# Patient Record
Sex: Female | Born: 1990 | Race: Black or African American | Hispanic: No | Marital: Single | Smoking: Former smoker
Health system: Southern US, Community
[De-identification: ages and names within clinical notes are randomized; demographics above are authoritative.]

## PROBLEM LIST (undated history)

## (undated) DIAGNOSIS — N390 Urinary tract infection, site not specified: Secondary | ICD-10-CM

---
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
## 2011-11-07 ENCOUNTER — Emergency Department (HOSPITAL_COMMUNITY)
Admission: EM | Admit: 2011-11-07 | Discharge: 2011-11-07 | Discharge status: Absent without leave | Payer: Self-pay | Source: Home / Self Care

## 2011-11-22 ENCOUNTER — Emergency Department (HOSPITAL_COMMUNITY)
Admission: EM | Admit: 2011-11-22 | Discharge: 2011-11-22 | Disposition: A | Discharge status: Home Health | Payer: Self-pay | Attending: Emergency Medicine | Admitting: Emergency Medicine

## 2011-11-22 DIAGNOSIS — N898 Other specified noninflammatory disorders of vagina: Secondary | ICD-10-CM | POA: Insufficient documentation

## 2011-11-22 DIAGNOSIS — N938 Other specified abnormal uterine and vaginal bleeding: Secondary | ICD-10-CM | POA: Insufficient documentation

## 2011-11-22 DIAGNOSIS — N949 Unspecified condition associated with female genital organs and menstrual cycle: Secondary | ICD-10-CM | POA: Insufficient documentation

## 2011-11-22 DIAGNOSIS — R109 Unspecified abdominal pain: Secondary | ICD-10-CM | POA: Insufficient documentation

## 2011-11-22 LAB — COMPREHENSIVE METABOLIC PANEL
ALT: 13 U/L (ref 0–35)
AST: 17 U/L (ref 0–37)
Albumin: 3.7 g/dL (ref 3.5–5.2)
Alkaline Phosphatase: 63 U/L (ref 39–117)
BUN: 12 mg/dL (ref 6–23)
CO2: 27 mEq/L (ref 19–32)
Calcium: 9.6 mg/dL (ref 8.4–10.5)
Chloride: 103 mEq/L (ref 96–112)
Creatinine, Ser: 0.82 mg/dL (ref 0.50–1.10)
GFR calc Af Amer: >90 mL/min (ref >90)
GFR calc non Af Amer: >90 mL/min (ref >90)
Glucose, Bld: 82 mg/dL (ref 70–99)
Potassium: 3.7 mEq/L (ref 3.5–5.1)
Sodium: 138 mEq/L (ref 135–145)
Total Bilirubin: 0.5 mg/dL (ref 0.3–1.2)
Total Protein: 7.2 g/dL (ref 6.0–8.3)

## 2011-11-22 LAB — URINALYSIS, ROUTINE W REFLEX MICROSCOPIC
Bilirubin Urine: NEGATIVE
Glucose, UA: NEGATIVE mg/dL
Ketones, ur: NEGATIVE mg/dL
Leukocytes, UA: NEGATIVE
Nitrite: NEGATIVE
Protein, ur: NEGATIVE mg/dL
Specific Gravity, Urine: 1.014 (ref 1.005–1.030)
Urobilinogen, UA: 0.2 mg/dL (ref 0.0–1.0)
pH: 7 (ref 5.0–8.0)

## 2011-11-22 LAB — CBC
HCT: 39.8 % (ref 36.0–46.0)
Hemoglobin: 13.8 g/dL (ref 12.0–15.0)
MCH: 31.2 pg (ref 26.0–34.0)
MCHC: 34.7 g/dL (ref 30.0–36.0)
MCV: 89.8 fL (ref 78.0–100.0)
Platelets: 263 10*3/uL (ref 150–400)
RBC: 4.43 MIL/uL (ref 3.87–5.11)
RDW: 12.9 % (ref 11.5–15.5)
WBC: 7 10*3/uL (ref 4.0–10.5)

## 2011-11-22 LAB — DIFFERENTIAL
Basophils Absolute: 0 10*3/uL (ref 0.0–0.1)
Basophils Relative: 1 % (ref 0–1)
Eosinophils Absolute: 0.1 10*3/uL (ref 0.0–0.7)
Eosinophils Relative: 1 % (ref 0–5)
Lymphocytes Relative: 27 % (ref 12–46)
Lymphs Abs: 1.9 10*3/uL (ref 0.7–4.0)
Monocytes Absolute: 0.5 10*3/uL (ref 0.1–1.0)
Monocytes Relative: 7 % (ref 3–12)
Neutro Abs: 4.5 10*3/uL (ref 1.7–7.7)
Neutrophils Relative %: 65 % (ref 43–77)

## 2011-11-22 LAB — URINE MICROSCOPIC-ADD ON

## 2011-11-22 LAB — WET PREP, GENITAL
Clue Cells Wet Prep HPF POC: NONE SEEN
Trich, Wet Prep: NONE SEEN
Yeast Wet Prep HPF POC: NONE SEEN

## 2011-11-22 LAB — PREGNANCY, URINE: Preg Test, Ur: NEGATIVE

## 2011-11-22 MED ORDER — MEDROXYPROGESTERONE ACETATE 5 MG PO TABS: 5.0000 mg | ORAL_TABLET | Freq: Every day | ORAL | Status: DC

## 2011-11-22 NOTE — ED Provider Notes (Signed)
History     CSN: [id number]  Arrival date & time 11/22/11  4098   First MD Initiated Contact with Patient 11/22/11 1021      Chief Complaint  Patient presents with  . Abdominal Pain    (Consider location/radiation/quality/duration/timing/severity/associated sxs/prior treatment) HPI Patient presents with abdominal pain and vaginal bleeding. She states that her LMP was the last week in October and she has been bleeding ever since. She denies a single day without vaginal bleeding since then. She uses approximately 2 pads or tampons a day. She has PMS symptoms monthly like her period is starting but she states that it is always there. She describes the bleeding as heavy, dark, and with blood clots. She states that she was on an oral contraceptive and stopped taking it in October. She reports abdominal pain that began about 2 weeks ago. She describes it as suprapubic and worse in the RLQ. She states it radiates to her R midabdominal area. She rates the pain as 7/10. She denies any syncope, dizziness, and headache.   No past medical history on file.  No past surgical history on file.  No family history on file.  History  Substance Use Topics  . Smoking status: Not on file  . Smokeless tobacco: Not on file  . Alcohol Use: Not on file    OB History    No data available      Review of Systems All pertinent positives and negatives reviewed in the history of present illness  Allergies  Review of patient's allergies indicates no known allergies.  Home Medications   Current Outpatient Rx  Name Route Sig Dispense Refill  . ACETAMINOPHEN 500 MG PO TABS Oral Take 1,000 mg by mouth every 6 (six) hours as needed. For pain    . VITAMIN C 500 MG PO TABS Oral Take 500 mg by mouth every other day.      BP 106/54  Pulse 71  Temp 98.1 F (36.7 C)  Resp 16  SpO2 99%  Physical Exam  Constitutional: She is oriented to person, place, and time. She appears well-developed and  well-nourished. No distress.       Patient is pleasant and cooperative. In NAD.  HENT:  Head: Normocephalic and atraumatic.  Cardiovascular: Normal rate, regular rhythm and normal heart sounds.   Pulmonary/Chest: Effort normal and breath sounds normal.  Abdominal: Soft. Normal appearance and bowel sounds are normal. She exhibits no distension and no mass. There is tenderness in the right lower quadrant and suprapubic area. There is no rebound and no guarding.  Genitourinary: Pelvic exam was performed with patient supine. There is no rash, tenderness, lesion or injury on the right labia. There is no rash, tenderness, lesion or injury on the left labia. Cervix exhibits no motion tenderness, no discharge and no friability. Right adnexum displays no mass and no tenderness. Left adnexum displays no mass and no tenderness. There is bleeding around the vagina. No erythema around the vagina. No foreign body around the vagina. No signs of injury around the vagina. No vaginal discharge found.  Neurological: She is alert and oriented to person, place, and time.  Skin: Skin is warm and dry. She is not diaphoretic.  Psychiatric: She has a normal mood and affect. Her behavior is normal.    ED Course  Procedures (including critical care time)  Labs Reviewed  URINALYSIS, ROUTINE W REFLEX MICROSCOPIC - Abnormal; Notable for the following:    Hgb urine dipstick MODERATE (*)  All other components within normal limits  WET PREP, GENITAL - Abnormal; Notable for the following:    WBC, Wet Prep HPF POC FEW (*)    All other components within normal limits  URINE MICROSCOPIC-ADD ON - Abnormal; Notable for the following:    Squamous Epithelial / LPF FEW (*)    All other components within normal limits  CBC  DIFFERENTIAL  PREGNANCY, URINE  COMPREHENSIVE METABOLIC PANEL  GC/CHLAMYDIA PROBE AMP, GENITAL   Abnormal Uterine Bleeding. Patient has normal hemoglobin and hematocrit. No adnexal or cervical tenderness  on exam. Checked on the patient and she states she is feeling better with less pain.    MDM  Patient most likely has dysfunctional uterine bleeding. Will treat with 5 days of Provera 10 mg PO. Patient will need to follow up with gynecologist.         Carlyle Dolly, PA-C 11/23/11 [phone]

## 2011-11-22 NOTE — ED Notes (Signed)
Pt does not appear to be in any distress. amb in the room and to the br. No c/o any.

## 2011-11-22 NOTE — ED Notes (Signed)
Pelvic exam assist. Cultures obtained.

## 2011-11-22 NOTE — ED Notes (Signed)
abd pain for 1 1/2 weeks has had vag bleeding since nov stop bcp in oct

## 2011-11-23 LAB — GC/CHLAMYDIA PROBE AMP, GENITAL
Chlamydia, DNA Probe: NEGATIVE
GC Probe Amp, Genital: NEGATIVE

## 2011-11-24 NOTE — ED Provider Notes (Signed)
Medical screening examination/treatment/procedure(s) were performed by non-physician practitioner and as supervising physician I was immediately available for consultation/collaboration.  Pancho Rushing, MD 11/24/11 0749 

## 2012-01-15 ENCOUNTER — Encounter (HOSPITAL_COMMUNITY): Payer: Self-pay | Admitting: *Deleted

## 2012-01-15 ENCOUNTER — Emergency Department (HOSPITAL_COMMUNITY)
Admission: EM | Admit: 2012-01-15 | Discharge: 2012-01-15 | Disposition: A | Discharge status: Home / Self Care | Payer: Medicaid Other | Attending: Emergency Medicine | Admitting: Emergency Medicine

## 2012-01-15 DIAGNOSIS — Z202 Contact with and (suspected) exposure to infections with a predominantly sexual mode of transmission: Secondary | ICD-10-CM | POA: Insufficient documentation

## 2012-01-15 LAB — URINALYSIS, ROUTINE W REFLEX MICROSCOPIC
Bilirubin Urine: NEGATIVE
Glucose, UA: NEGATIVE mg/dL
Hgb urine dipstick: NEGATIVE
Protein, ur: NEGATIVE mg/dL
Urobilinogen, UA: 0.2 mg/dL (ref 0.0–1.0)

## 2012-01-15 LAB — URINE MICROSCOPIC-ADD ON

## 2012-01-15 NOTE — ED Notes (Signed)
The pt has had lower abd pain for 10 days and she is late for her period 10 days .  She has been having sex with someone she has  Marthe Patch has a std and she  Wants to be tested for everything.  lmp   Deb 22

## 2012-01-15 NOTE — ED Notes (Signed)
Pt states that she has had a late period for the past 9 days. Pt states she is having lower abdominal pain. Pt seen in Jan for similar abdominal pain. Pt state she would also liked to be checked for STDs. Pt states that when the pain does come it is a sharp shooting pain. Pt denies any problems with bowel movements but does state some discharge.

## 2012-01-17 ENCOUNTER — Emergency Department (HOSPITAL_COMMUNITY)
Admission: EM | Admit: 2012-01-17 | Discharge: 2012-01-17 | Disposition: A | Discharge status: Home / Self Care | Payer: Self-pay | Attending: Emergency Medicine | Admitting: Emergency Medicine

## 2012-01-17 ENCOUNTER — Encounter (HOSPITAL_COMMUNITY): Payer: Self-pay | Admitting: *Deleted

## 2012-01-17 DIAGNOSIS — M549 Dorsalgia, unspecified: Secondary | ICD-10-CM | POA: Insufficient documentation

## 2012-01-17 DIAGNOSIS — N760 Acute vaginitis: Secondary | ICD-10-CM | POA: Insufficient documentation

## 2012-01-17 DIAGNOSIS — R109 Unspecified abdominal pain: Secondary | ICD-10-CM | POA: Insufficient documentation

## 2012-01-17 DIAGNOSIS — R509 Fever, unspecified: Secondary | ICD-10-CM | POA: Insufficient documentation

## 2012-01-17 DIAGNOSIS — N898 Other specified noninflammatory disorders of vagina: Secondary | ICD-10-CM | POA: Insufficient documentation

## 2012-01-17 DIAGNOSIS — B9689 Other specified bacterial agents as the cause of diseases classified elsewhere: Secondary | ICD-10-CM | POA: Insufficient documentation

## 2012-01-17 DIAGNOSIS — Z202 Contact with and (suspected) exposure to infections with a predominantly sexual mode of transmission: Secondary | ICD-10-CM | POA: Insufficient documentation

## 2012-01-17 DIAGNOSIS — A499 Bacterial infection, unspecified: Secondary | ICD-10-CM | POA: Insufficient documentation

## 2012-01-17 LAB — WET PREP, GENITAL: Trich, Wet Prep: NONE SEEN

## 2012-01-17 LAB — PREGNANCY, URINE: Preg Test, Ur: NEGATIVE

## 2012-01-17 LAB — URINALYSIS, ROUTINE W REFLEX MICROSCOPIC
Bilirubin Urine: NEGATIVE
Nitrite: NEGATIVE
Protein, ur: NEGATIVE mg/dL
Specific Gravity, Urine: 1.015 (ref 1.005–1.030)
Urobilinogen, UA: 0.2 mg/dL (ref 0.0–1.0)

## 2012-01-17 MED ORDER — ONDANSETRON 4 MG PO TBDP
4.0000 mg | ORAL_TABLET | Freq: Once | ORAL | Status: AC
  Administered 2012-01-17: 4 mg via ORAL
  Filled 2012-01-17: qty 1

## 2012-01-17 MED ORDER — METRONIDAZOLE 500 MG PO TABS
2000.0000 mg | ORAL_TABLET | Freq: Once | ORAL | Status: AC
  Administered 2012-01-17: 2000 mg via ORAL
  Filled 2012-01-17: qty 4

## 2012-01-17 MED ORDER — AZITHROMYCIN 250 MG PO TABS
1000.0000 mg | ORAL_TABLET | Freq: Once | ORAL | Status: AC
  Administered 2012-01-17: 1000 mg via ORAL
  Filled 2012-01-17: qty 4

## 2012-01-17 MED ORDER — CEFTRIAXONE SODIUM 250 MG IJ SOLR
250.0000 mg | Freq: Once | INTRAMUSCULAR | Status: AC
  Administered 2012-01-17: 250 mg via INTRAMUSCULAR
  Filled 2012-01-17: qty 250

## 2012-01-17 NOTE — ED Provider Notes (Signed)
History   This chart was scribed for Glynn Octave, MD by Clarita Crane and Davonna Belling. The patient was seen in room APA03/APA03. Patient's care was started at 1202.    CSN: [id number]  Arrival date & time 01/17/12  1202   None     Chief Complaint  Patient presents with  . Abdominal Pain    (Consider location/radiation/quality/duration/timing/severity/associated sxs/prior treatment) HPI Martha Sandoval is a 21 y.o. female who presents to the Emergency Department complaining of intermittent episodes of moderate lower abdominal pain described as sharp onset 3 weeks ago and persistent since with associated subjective fever, back pain and mild vaginal discharge. Notes episodes last several minutes before resolving on own. States abdominal pain is aggravated and relieved by nothing. Reports having unprotected sexual intercourse with female diagnosed with chlamydia 8-9 weeks ago. Denies nausea, vomiting, dysuria, hematuria and vaginal bleeding.   History reviewed. No pertinent past medical history.  History reviewed. No pertinent past surgical history.  History reviewed. No pertinent family history.  History  Substance Use Topics  . Smoking status: Never Smoker   . Smokeless tobacco: Not on file  . Alcohol Use: No    OB History    Grav Para Term Preterm Abortions TAB SAB Ect Mult Living                  Review of Systems 10 Systems reviewed and are negative for acute change except as noted in the HPI.  Allergies  Review of patient's allergies indicates no known allergies.  Home Medications   Current Outpatient Rx  Name Route Sig Dispense Refill  . ACETAMINOPHEN 500 MG PO TABS Oral Take 500 mg by mouth every 6 (six) hours as needed. Pain      BP 101/59  Pulse 70  Temp(Src) 98.2 F (36.8 C) (Oral)  Resp 20  Ht 5\' 3"  (1.6 m)  Wt 133 lb (60.328 kg)  BMI 23.56 kg/m2  SpO2 100%  LMP 12/18/2011  Physical Exam  Nursing note and vitals reviewed. Constitutional: She  is oriented to person, place, and time. She appears well-developed and well-nourished. No distress.  HENT:  Head: Normocephalic and atraumatic.  Eyes: EOM are normal. Pupils are equal, round, and reactive to light.  Neck: Neck supple. No tracheal deviation present.  Cardiovascular: Normal rate, regular rhythm and normal heart sounds.  Exam reveals no gallop and no friction rub.   No murmur heard.      DP and PT pulses intact.   Pulmonary/Chest: Effort normal. No respiratory distress. She has no wheezes. She has no rales.  Abdominal: Soft. Bowel sounds are normal. She exhibits no distension. There is tenderness (lower abdomen, minimal). There is no rebound, no guarding and no CVA tenderness.  Genitourinary: There is no tenderness on the right labia. There is no tenderness on the left labia. Cervix exhibits discharge and friability. Cervix exhibits no motion tenderness. Right adnexum displays tenderness. Right adnexum displays no mass. Left adnexum displays no mass. Vaginal discharge found.  Musculoskeletal: Normal range of motion. She exhibits no edema.  Neurological: She is alert and oriented to person, place, and time. No sensory deficit.  Skin: Skin is warm and dry.  Psychiatric: She has a normal mood and affect. Her behavior is normal.    ED Course  Procedures (including critical care time) DIAGNOSTIC STUDIES: Oxygen Saturation is 100% on room air, normal by my interpretation.    COORDINATION OF CARE: 1.03PM- Patient informed of current plan for treatment and evaluation and  agrees with plan at this time.  2:22PM- Labs results shared with patient. Informed of intent to d/c. Patient agrees with plan.    Labs Reviewed  WET PREP, GENITAL - Abnormal; Notable for the following:    Clue Cells Wet Prep HPF POC FEW (*)    WBC, Wet Prep HPF POC TOO NUMEROUS TO COUNT (*)    All other components within normal limits  URINALYSIS, ROUTINE W REFLEX MICROSCOPIC  PREGNANCY, URINE  GC/CHLAMYDIA  PROBE AMP, GENITAL   No results found.   1. Bacterial vaginosis   2. Exposure to STD       MDM  3 weeks of intermittent lower abdominal pain with exposure to chlamydia. Associated vaginal discharge. Nontoxic appearing, vitals stable, abdomen soft.  Pelvic, UA, HCG.  Patient requests HIV testing and is referred to health department.     I personally performed the services described in this documentation, which was scribed in my presence.  The recorded information has been reviewed and considered.    Glynn Octave, MD 01/17/12 [phone]

## 2012-01-17 NOTE — ED Notes (Signed)
abd pain, wants to be checked for std's,wants pregnancy test.  Seen at [facility] for same , but had to leave before treatment.

## 2012-01-17 NOTE — ED Notes (Signed)
Pt presents to er today with request for pregnancy testing, std testing and hiv testing. Pt also having lower abd pain, pt ? Exposure to std.

## 2012-01-17 NOTE — Discharge Instructions (Signed)
Bacterial Vaginosis Bacterial vaginosis (BV) is a vaginal infection where the normal balance of bacteria in the vagina is disrupted. The normal balance is then replaced by an overgrowth of certain bacteria. There are several different kinds of bacteria that can cause BV. BV is the most common vaginal infection in women of childbearing age. CAUSES   The cause of BV is not fully understood. BV develops when there is an increase or imbalance of harmful bacteria.   Some activities or behaviors can upset the normal balance of bacteria in the vagina and put women at increased risk including:   Having a new sex partner or multiple sex partners.   Douching.   Using an intrauterine device (IUD) for contraception.   It is not clear what role sexual activity plays in the development of BV. However, women that have never had sexual intercourse are rarely infected with BV.  Women do not get BV from toilet seats, bedding, swimming pools or from touching objects around them.  SYMPTOMS   Grey vaginal discharge.   A fish-like odor with discharge, especially after sexual intercourse.   Itching or burning of the vagina and vulva.   Burning or pain with urination.   Some women have no signs or symptoms at all.  DIAGNOSIS  Your caregiver must examine the vagina for signs of BV. Your caregiver will perform lab tests and look at the sample of vaginal fluid through a microscope. They will look for bacteria and abnormal cells (clue cells), a pH test higher than 4.5, and a positive amine test all associated with BV.  RISKS AND COMPLICATIONS   Pelvic inflammatory disease (PID).   Infections following gynecology surgery.   Developing HIV.   Developing herpes virus.  TREATMENT  Sometimes BV will clear up without treatment. However, all women with symptoms of BV should be treated to avoid complications, especially if gynecology surgery is planned. Female partners generally do not need to be treated. However,  BV may spread between female sex partners so treatment is helpful in preventing a recurrence of BV.   BV may be treated with antibiotics. The antibiotics come in either pill or vaginal cream forms. Either can be used with nonpregnant or pregnant women, but the recommended dosages differ. These antibiotics are not harmful to the baby.   BV can recur after treatment. If this happens, a second round of antibiotics will often be prescribed.   Treatment is important for pregnant women. If not treated, BV can cause a premature delivery, especially for a pregnant woman who had a premature birth in the past. All pregnant women who have symptoms of BV should be checked and treated.   For chronic reoccurrence of BV, treatment with a type of prescribed gel vaginally twice a week is helpful.  HOME CARE INSTRUCTIONS   Finish all medication as directed by your caregiver.   Do not have sex until treatment is completed.   Tell your sexual partner that you have a vaginal infection. They should see their caregiver and be treated if they have problems, such as a mild rash or itching.   Practice safe sex. Use condoms. Only have 1 sex partner.  PREVENTION  Basic prevention steps can help reduce the risk of upsetting the natural balance of bacteria in the vagina and developing BV:  Do not have sexual intercourse (be abstinent).   Do not douche.   Use all of the medicine prescribed for treatment of BV, even if the signs and symptoms go away.     prescribed for treatment of BV, even if the signs and symptoms go away.   Tell your sex partner if you have BV. That way, they can be treated, if needed, to prevent reoccurrence.  SEEK MEDICAL CARE IF:    Your symptoms are not improving after 3 days of treatment.   You have increased discharge, pain, or fever.  MAKE SURE YOU:    Understand these instructions.   Will watch your condition.   Will get help right away if you are not doing well or get worse.  FOR MORE INFORMATION   Division of STD Prevention (DSTDP), Centers for Disease Control and Prevention:  www.cdc.gov/std  American Social Health Association (ASHA): www.ashastd.org   Document Released: 10/17/2005 Document Revised: 10/06/2011 Document Reviewed: 04/09/2009  ExitCare Patient Information 2012 [employer].  Sexually Transmitted Disease  Sexually transmitted disease (STD) refers to any infection that is passed from person to person during sexual activity. This may happen by way of saliva, semen, blood, vaginal mucus, or urine. Common STDs include:   Gonorrhea.   Chlamydia.   Syphilis.   HIV/AIDS.   Genital herpes.   Hepatitis B and C.   Trichomonas.   Human papillomavirus (HPV).   Pubic lice.  CAUSES   An STD may be spread by bacteria, virus, or parasite. A person can get an STD by:   Sexual intercourse with an infected person.   Sharing sex toys with an infected person.   Sharing needles with an infected person.   Having intimate contact with the genitals, mouth, or rectal areas of an infected person.  SYMPTOMS   Some people may not have any symptoms, but they can still pass the infection to others. Different STDs have different symptoms. Symptoms include:   Painful or bloody urination.   Pain in the pelvis, abdomen, vagina, anus, throat, or eyes.   Skin rash, itching, irritation, growths, or sores (lesions). These usually occur in the genital or anal area.   Abnormal vaginal discharge.   Penile discharge in men.   Soft, flesh-colored skin growths in the genital or anal area.   Fever.   Pain or bleeding during sexual intercourse.   Swollen glands in the groin area.   Yellow skin and eyes (jaundice). This is seen with hepatitis.  DIAGNOSIS   To make a diagnosis, your caregiver may:   Take a medical history.   Perform a physical exam.   Take a specimen (culture) to be examined.   Examine a sample of discharge under a microscope.   Perform blood tests.   Perform a Pap test, if this applies.   Perform a colposcopy.   Perform a laparoscopy.  TREATMENT    Chlamydia, gonorrhea,  trichomonas, and syphilis can be cured with antibiotic medicine.   Genital herpes, hepatitis, and HIV can be treated, but not cured, with prescribed medicines. The medicines will lessen the symptoms.   Genital warts from HPV can be treated with medicine or by freezing, burning (electrocautery), or surgery. Warts may come back.   HPV is a virus and cannot be cured with medicine or surgery.However, abnormal areas may be followed very closely by your caregiver and may be removed from the cervix, vagina, or vulva through office procedures or surgery.  If your diagnosis is confirmed, your recent sexual partners need treatment. This is true even if they are symptom-free or have a negative culture or evaluation. They should not have sex until their caregiver says it is okay.  HOME CARE INSTRUCTIONS     All sexual partners should be informed, tested, and treated for all STDs.   Take your antibiotics as directed. Finish them even if you start to feel better.   Only take over-the-counter or prescription medicines for pain, discomfort, or fever as directed by your caregiver.   Rest.   Eat a balanced diet and drink enough fluids to keep your urine clear or pale yellow.   Do not have sex until treatment is completed and you have followed up with your caregiver. STDs should be checked after treatment.   Keep all follow-up appointments, Pap tests, and blood tests as directed by your caregiver.   Only use latex condoms and water-soluble lubricants during sexual activity. Do not use petroleum jelly or oils.   Avoid alcohol and illegal drugs.   Get vaccinated for HPV and hepatitis. If you have not received these vaccines in the past, talk to your caregiver about whether one or both might be right for you.   Avoid risky sex practices that can break the skin.  The only way to avoid getting an STD is to avoid all sexual activity.Latex condoms and dental dams (for oral sex) will help lessen the risk of getting an STD, but  will not completely eliminate the risk.  SEEK MEDICAL CARE IF:    You have a fever.   You have any new or worsening symptoms.  Document Released: 01/07/2003 Document Revised: 10/06/2011 Document Reviewed: 01/14/2011  ExitCare Patient Information 2012 [employer].

## 2012-01-17 NOTE — ED Notes (Signed)
Pelvic exam performed by Dr. Manus Gunning, specimen's collected, pt tolerated well,

## 2012-01-18 LAB — GC/CHLAMYDIA PROBE AMP, GENITAL: GC Probe Amp, Genital: NEGATIVE

## 2012-01-19 NOTE — ED Notes (Signed)
+  Chlamydia. Patient treated with Rocephin and Zithromax. 

## 2012-01-19 NOTE — ED Notes (Signed)
Called patient and informed them of + result. Advised to abstain from sex for 2 weeks and to inform partners to get treated.

## 2012-10-22 ENCOUNTER — Encounter (HOSPITAL_COMMUNITY): Payer: Self-pay | Admitting: Unknown Physician Specialty

## 2012-10-22 ENCOUNTER — Emergency Department (HOSPITAL_COMMUNITY): Payer: Medicaid Other

## 2012-10-22 ENCOUNTER — Emergency Department (HOSPITAL_COMMUNITY)
Admission: EM | Admit: 2012-10-22 | Discharge: 2012-10-22 | Disposition: A | Discharge status: Home / Self Care | Payer: Medicaid Other | Attending: Emergency Medicine | Admitting: Emergency Medicine

## 2012-10-22 DIAGNOSIS — F17200 Nicotine dependence, unspecified, uncomplicated: Secondary | ICD-10-CM | POA: Insufficient documentation

## 2012-10-22 DIAGNOSIS — S139XXA Sprain of joints and ligaments of unspecified parts of neck, initial encounter: Secondary | ICD-10-CM | POA: Insufficient documentation

## 2012-10-22 DIAGNOSIS — Y9389 Activity, other specified: Secondary | ICD-10-CM | POA: Insufficient documentation

## 2012-10-22 DIAGNOSIS — M25519 Pain in unspecified shoulder: Secondary | ICD-10-CM

## 2012-10-22 DIAGNOSIS — S161XXA Strain of muscle, fascia and tendon at neck level, initial encounter: Secondary | ICD-10-CM

## 2012-10-22 DIAGNOSIS — S4980XA Other specified injuries of shoulder and upper arm, unspecified arm, initial encounter: Secondary | ICD-10-CM | POA: Insufficient documentation

## 2012-10-22 DIAGNOSIS — S46909A Unspecified injury of unspecified muscle, fascia and tendon at shoulder and upper arm level, unspecified arm, initial encounter: Secondary | ICD-10-CM | POA: Insufficient documentation

## 2012-10-22 DIAGNOSIS — Y92410 Unspecified street and highway as the place of occurrence of the external cause: Secondary | ICD-10-CM | POA: Insufficient documentation

## 2012-10-22 MED ORDER — IBUPROFEN 800 MG PO TABS
800.0000 mg | ORAL_TABLET | Freq: Once | ORAL | Status: AC
  Administered 2012-10-22: 800 mg via ORAL
  Filled 2012-10-22: qty 1

## 2012-10-22 MED ORDER — IBUPROFEN 800 MG PO TABS: 800.0000 mg | ORAL_TABLET | Freq: Three times a day (TID) | ORAL | Status: DC
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
## 2013-07-10 ENCOUNTER — Emergency Department (HOSPITAL_COMMUNITY)
Admission: EM | Admit: 2013-07-10 | Discharge: 2013-07-11 | Disposition: A | Discharge status: Home / Self Care | Payer: Medicaid Other | Attending: Emergency Medicine | Admitting: Emergency Medicine

## 2013-07-10 ENCOUNTER — Encounter (HOSPITAL_COMMUNITY): Payer: Self-pay | Admitting: *Deleted

## 2013-07-10 DIAGNOSIS — S91309A Unspecified open wound, unspecified foot, initial encounter: Secondary | ICD-10-CM | POA: Insufficient documentation

## 2013-07-10 DIAGNOSIS — Z23 Encounter for immunization: Secondary | ICD-10-CM | POA: Insufficient documentation

## 2013-07-10 DIAGNOSIS — W268XXA Contact with other sharp object(s), not elsewhere classified, initial encounter: Secondary | ICD-10-CM | POA: Insufficient documentation

## 2013-07-10 DIAGNOSIS — Y929 Unspecified place or not applicable: Secondary | ICD-10-CM | POA: Insufficient documentation

## 2013-07-10 DIAGNOSIS — Y9389 Activity, other specified: Secondary | ICD-10-CM | POA: Insufficient documentation

## 2013-07-10 DIAGNOSIS — F17200 Nicotine dependence, unspecified, uncomplicated: Secondary | ICD-10-CM | POA: Insufficient documentation

## 2013-07-10 DIAGNOSIS — S91311A Laceration without foreign body, right foot, initial encounter: Secondary | ICD-10-CM

## 2013-07-10 MED ORDER — LIDOCAINE HCL (PF) 1 % IJ SOLN
INTRAMUSCULAR | Status: AC
  Administered 2013-07-10
  Filled 2013-07-10: qty 5

## 2013-07-10 MED ORDER — TETANUS-DIPHTH-ACELL PERTUSSIS 5-2.5-18.5 LF-MCG/0.5 IM SUSP
0.5000 mL | Freq: Once | INTRAMUSCULAR | Status: AC
  Administered 2013-07-10: 0.5 mL via INTRAMUSCULAR
  Filled 2013-07-10: qty 0.5
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
## 2013-07-10 NOTE — ED Provider Notes (Signed)
CSN: [id number]     Arrival date & time 07/10/13  2225 History   First MD Initiated Contact with Patient 07/10/13 2308     Chief Complaint  Patient presents with  . Extremity Laceration   (Consider location/radiation/quality/duration/timing/severity/associated sxs/prior Treatment) Patient is a 22 y.o. female presenting with skin laceration. The history is provided by the patient.  Laceration Location:  Foot Foot laceration location:  R foot (lateral right foot) Depth:  Through dermis Quality: straight   Bleeding: controlled   Time since incident:  2 hours Laceration mechanism:  Broken glass Pain details:    Quality:  Aching   Severity:  Mild   Timing:  Constant   Progression:  Unchanged Foreign body present:  No foreign bodies Relieved by:  Nothing Worsened by:  Nothing tried Ineffective treatments:  None tried Tetanus status:  Unknown   History reviewed. No pertinent past medical history. History reviewed. No pertinent past surgical history. History reviewed. No pertinent family history. History  Substance Use Topics  . Smoking status: Current Every Day Smoker  . Smokeless tobacco: Not on file  . Alcohol Use: No   OB History   Grav Para Term Preterm Abortions TAB SAB Ect Mult Living                 Review of Systems  Constitutional: Negative for fever and chills.  Musculoskeletal: Negative for back pain, joint swelling and arthralgias.  Skin: Positive for wound.       Laceration   Neurological: Negative for dizziness, weakness and numbness.  Hematological: Does not bruise/bleed easily.  All other systems reviewed and are negative.    Allergies  Review of patient's allergies indicates no known allergies.  Home Medications   Current Outpatient Rx  Name  Route  Sig  Dispense  Refill  . ibuprofen (ADVIL,MOTRIN) 200 MG tablet   Oral   Take 200 mg by mouth every 6 (six) hours as needed. For pain         . ibuprofen (ADVIL,MOTRIN) 800 MG tablet   Oral  Take 1 tablet (800 mg total) by mouth 3 (three) times daily.   21 tablet   0    BP 146/91  Pulse 119  Temp(Src) 98.3 F (36.8 C) (Oral)  Resp 24  Ht 5\' 2"  (1.575 m)  Wt 130 lb (58.968 kg)  BMI 23.77 kg/m2  SpO2 100%  LMP 06/28/2013 Physical Exam  Nursing note and vitals reviewed. Constitutional: She is oriented to person, place, and time. She appears well-developed and well-nourished. No distress.  HENT:  Head: Normocephalic and atraumatic.  Cardiovascular: Normal rate, regular rhythm, normal heart sounds and intact distal pulses.   No murmur heard. Pulmonary/Chest: Effort normal and breath sounds normal. No respiratory distress.  Musculoskeletal: Normal range of motion. She exhibits tenderness. She exhibits no edema.       Right foot: She exhibits tenderness and laceration. She exhibits normal range of motion, no bony tenderness, no swelling, normal capillary refill, no crepitus and no deformity.       Feet:  Pt has full ROM of the foot and toes.  Distal sensation intact.  DP pulse brisk.  No FB's, muscle, nerve or tendon injury seen  Neurological: She is alert and oriented to person, place, and time. She exhibits normal muscle tone. Coordination normal.  Skin: Skin is warm and dry.    ED Course  Procedures (including critical care time) Labs Review Labs Reviewed - No data to display Imaging Review No  results found.  MDM   LACERATION REPAIR Performed by: Thadeus Gandolfi L. Authorized by: Maxwell Caul Consent: Verbal consent obtained. Risks and benefits: risks, benefits and alternatives were discussed Consent given by: patient Patient identity confirmed: provided demographic data Prepped and Draped in normal sterile fashion Wound explored  Laceration Location: lateral right foot Laceration Length: 3 cm  No Foreign Bodies seen or palpated  Anesthesia: local infiltration  Local anesthetic: lidocaine 1% w/o epinephrine  Anesthetic total:3 ml  Irrigation  method: syringe Amount of cleaning: standard  Skin closure: 4-0 prolene Number of sutures: 4 Technique: simple interrupted Patient tolerance: Patient tolerated the procedure well with no immediate complications.    Wound was examined.  No FB's seen.  Bleeding controlled, remains NV intact.  TDaP given.  Bandage applied.    Patient agrees to elevate , clean with soap and water and sutures out in 10 days.  Instructed to return here for signs of infection  Tawonna Esquer L. Trisha Mangle, PA-C 07/11/13 [phone]

## 2013-07-11 NOTE — ED Provider Notes (Signed)
Medical screening examination/treatment/procedure(s) were performed by non-physician practitioner and as supervising physician I was immediately available for consultation/collaboration.   Shelda Jakes, MD 07/11/13 [phone]

## 2013-07-11 NOTE — ED Notes (Signed)
Pt alert & oriented x4, stable gait. Patient given discharge instructions, paperwork & prescription(s). Patient  instructed to stop at the registration desk to finish any additional paperwork. Patient verbalized understanding. Pt left department w/ no further questions. 

## 2013-07-14 ENCOUNTER — Encounter (HOSPITAL_COMMUNITY): Payer: Self-pay | Admitting: *Deleted

## 2013-07-14 ENCOUNTER — Emergency Department (HOSPITAL_COMMUNITY)
Admission: EM | Admit: 2013-07-14 | Discharge: 2013-07-14 | Disposition: A | Discharge status: Home / Self Care | Payer: Medicaid Other | Attending: Emergency Medicine | Admitting: Emergency Medicine

## 2013-07-14 DIAGNOSIS — Y838 Other surgical procedures as the cause of abnormal reaction of the patient, or of later complication, without mention of misadventure at the time of the procedure: Secondary | ICD-10-CM | POA: Insufficient documentation

## 2013-07-14 DIAGNOSIS — F17200 Nicotine dependence, unspecified, uncomplicated: Secondary | ICD-10-CM | POA: Insufficient documentation

## 2013-07-14 DIAGNOSIS — T8140XA Infection following a procedure, unspecified, initial encounter: Secondary | ICD-10-CM | POA: Insufficient documentation

## 2013-07-14 DIAGNOSIS — T798XXA Other early complications of trauma, initial encounter: Secondary | ICD-10-CM

## 2013-07-14 MED ORDER — CEPHALEXIN 500 MG PO CAPS: 500.0000 mg | ORAL_CAPSULE | Freq: Three times a day (TID) | ORAL | Status: DC

## 2013-07-14 MED ORDER — BACITRACIN ZINC 500 UNIT/GM EX OINT
TOPICAL_OINTMENT | CUTANEOUS | Status: AC
  Administered 2013-07-14: 1
  Filled 2013-07-14: qty 0.9

## 2013-07-14 NOTE — ED Provider Notes (Signed)
CSN: [id number]     Arrival date & time 07/14/13  1507 History   First MD Initiated Contact with Patient 07/14/13 1523     Chief Complaint  Patient presents with  . Wound Check   (Consider location/radiation/quality/duration/timing/severity/associated sxs/prior Treatment) HPI Martha Sandoval is a 22 y.o. female who presents to the ED for recheck of her laceration to the right foot from 4 days ago. She was evaluated in the ED here and had sutures placed in the right foot. Today she complains of tenderness and redness at the area of the laceration. She reports a small amount of drainage came from the area earlier today. 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 Not sure if that is irritating the area or not. She denies fever or other problems.  History reviewed. No pertinent past medical history. History reviewed. No pertinent past surgical history. No family history on file. History  Substance Use Topics  . Smoking status: Current Every Day Smoker  . Smokeless tobacco: Not on file  . Alcohol Use: No   OB History   Grav Para Term Preterm Abortions TAB SAB Ect Mult Living                 Review of Systems  Constitutional: Negative for fever and chills.  Gastrointestinal: Negative for nausea and vomiting.  Musculoskeletal:       Pain right foot  Skin: Positive for wound.  Psychiatric/Behavioral: The patient is not nervous/anxious.     Allergies  Review of patient's allergies indicates no known allergies.  Home Medications   Current Outpatient Rx  Name  Route  Sig  Dispense  Refill  . ibuprofen (ADVIL,MOTRIN) 200 MG tablet   Oral   Take 200 mg by mouth every 6 (six) hours as needed. For pain          BP 125/91  Pulse 112  Temp(Src) 98.4 F (36.9 C) (Oral)  Ht 5\' 2"  (1.575 m)  Wt 130 lb (58.968 kg)  BMI 23.77 kg/m2  SpO2 100%  LMP 06/28/2013 Physical Exam  Nursing note and vitals reviewed. Constitutional: She is oriented to person, place, and  time. She appears well-developed and well-nourished. No distress.  HENT:  Head: Normocephalic.  Eyes: EOM are normal.  Neck: Neck supple.  Cardiovascular: Normal rate.   Pulmonary/Chest: Effort normal.  Musculoskeletal:       Right foot: She exhibits tenderness and laceration. She exhibits normal range of motion and normal capillary refill.  Healing laceration lateral aspect of right foot. Sutures in place. There is a small area of erythema surrounding the laceration and the area is tender with palpation. No drainage noted.  Neurological: She is alert and oriented to person, place, and time. No cranial nerve deficit.  Skin: Skin is warm and dry.  Psychiatric: She has a normal mood and affect. Her behavior is normal.    ED Course  Procedures Removed one suture from mid point of laceration, no drainage noted.   MDM  22 y.o. female with tenderness and erythema surrounding suture site right foot. Will start antibiotics and have her return as scheduled for removal of the remaining sutures. She will return sooner if symptoms worsen.  Discussed with the patient and all questioned fully answered.   Medication List    TAKE these medications       cephALEXin 500 MG capsule  Commonly known as:  KEFLEX  Take 1 capsule (500 mg total) by mouth 3 (three)  times daily.      ASK your doctor about these medications       ibuprofen 200 MG tablet  Commonly known as:  ADVIL,MOTRIN  Take 200 mg by mouth every 6 (six) hours as needed. For pain           Janne Napoleon, NP 07/14/13 [phone]

## 2013-07-14 NOTE — ED Notes (Signed)
Pt had sutures placed to right foot Saturday. States pain to the area.

## 2013-07-15 NOTE — ED Provider Notes (Signed)
Medical screening examination/treatment/procedure(s) were performed by non-physician practitioner and as supervising physician I was immediately available for consultation/collaboration.  Dominga Mcduffie L Nthony Lefferts, MD 07/15/13 1647 

## 2013-09-21 ENCOUNTER — Encounter (HOSPITAL_COMMUNITY): Payer: Self-pay | Admitting: Emergency Medicine

## 2013-09-21 ENCOUNTER — Emergency Department (HOSPITAL_COMMUNITY)
Admission: EM | Admit: 2013-09-21 | Discharge: 2013-09-21 | Disposition: A | Discharge status: Home / Self Care | Payer: Medicaid Other | Attending: Emergency Medicine | Admitting: Emergency Medicine

## 2013-09-21 DIAGNOSIS — Z3202 Encounter for pregnancy test, result negative: Secondary | ICD-10-CM | POA: Insufficient documentation

## 2013-09-21 DIAGNOSIS — N390 Urinary tract infection, site not specified: Secondary | ICD-10-CM | POA: Insufficient documentation

## 2013-09-21 DIAGNOSIS — R112 Nausea with vomiting, unspecified: Secondary | ICD-10-CM | POA: Insufficient documentation

## 2013-09-21 DIAGNOSIS — R82998 Other abnormal findings in urine: Secondary | ICD-10-CM | POA: Insufficient documentation

## 2013-09-21 DIAGNOSIS — F17200 Nicotine dependence, unspecified, uncomplicated: Secondary | ICD-10-CM | POA: Insufficient documentation

## 2013-09-21 LAB — URINALYSIS, ROUTINE W REFLEX MICROSCOPIC
Bilirubin Urine: NEGATIVE
Hgb urine dipstick: NEGATIVE
Specific Gravity, Urine: 1.016 (ref 1.005–1.030)
Urobilinogen, UA: 1 mg/dL (ref 0.0–1.0)

## 2013-09-21 LAB — COMPREHENSIVE METABOLIC PANEL
ALT: 12 U/L (ref 0–35)
Alkaline Phosphatase: 80 U/L (ref 39–117)
CO2: 30 mEq/L (ref 19–32)
Chloride: 102 mEq/L (ref 96–112)
Creatinine, Ser: 0.98 mg/dL (ref 0.50–1.10)
GFR calc Af Amer: >90 mL/min (ref >90)
Glucose, Bld: 69 mg/dL — ABNORMAL LOW (ref 70–99)
Potassium: 3.4 mEq/L — ABNORMAL LOW (ref 3.5–5.1)
Sodium: 141 mEq/L (ref 135–145)
Total Protein: 8 g/dL (ref 6.0–8.3)

## 2013-09-21 LAB — CBC WITH DIFFERENTIAL/PLATELET
Eosinophils Absolute: 0.1 10*3/uL (ref 0.0–0.7)
Lymphocytes Relative: 19 % (ref 12–46)
Lymphs Abs: 1.9 10*3/uL (ref 0.7–4.0)
MCHC: 33.9 g/dL (ref 30.0–36.0)
Neutro Abs: 7.4 10*3/uL (ref 1.7–7.7)
Neutrophils Relative %: 74 % (ref 43–77)
Platelets: 302 10*3/uL (ref 150–400)
RBC: 4.85 MIL/uL (ref 3.87–5.11)
WBC: 10.1 10*3/uL (ref 4.0–10.5)

## 2013-09-21 LAB — URINE MICROSCOPIC-ADD ON

## 2013-09-21 LAB — LIPASE, BLOOD: Lipase: 24 U/L (ref 11–59)

## 2013-09-21 MED ORDER — POTASSIUM CHLORIDE CRYS ER 20 MEQ PO TBCR
40.0000 meq | EXTENDED_RELEASE_TABLET | Freq: Once | ORAL | Status: AC
  Administered 2013-09-21: 40 meq via ORAL
  Filled 2013-09-21: qty 2

## 2013-09-21 MED ORDER — HYDROCODONE-ACETAMINOPHEN 5-325 MG PO TABS
1.0000 | ORAL_TABLET | Freq: Once | ORAL | Status: AC
  Administered 2013-09-21: 1 via ORAL
  Filled 2013-09-21: qty 1

## 2013-09-21 MED ORDER — HYDROCODONE-ACETAMINOPHEN 5-325 MG PO TABS: 1.0000 | ORAL_TABLET | Freq: Four times a day (QID) | ORAL | Status: DC | PRN

## 2013-09-21 MED ORDER — CIPROFLOXACIN HCL 500 MG PO TABS: 500.0000 mg | ORAL_TABLET | Freq: Two times a day (BID) | ORAL | Status: DC

## 2013-09-21 NOTE — ED Notes (Signed)
The pt is c/o lower abd pain with n v for 2 days.  She thinks she may be pregnant but her preg tests are neg.  lmp last month

## 2013-09-21 NOTE — ED Provider Notes (Signed)
CSN: [id number]     Arrival date & time 09/21/13  1504 History   First MD Initiated Contact with Patient 09/21/13 1608     Chief Complaint  Patient presents with  . Abdominal Pain   (Consider location/radiation/quality/duration/timing/severity/associated sxs/prior Treatment) HPI Comments: Patient is a 22 year old female presenting to the emergency department for 4 days of intermittent lower abdominal cramping with radiation to back on right side. The patient rates her pain 8/10 currently. She states she has had chills, but has not taken her temperature. The patient also endorses nausea and non-bloody non-bilious emesis. She states her pain is positional. She denies any alleviating factors. The patient is concerned that she may be pregnant as she is just under a week late on her menstrual period. She states she has taken at home pregnancy tests, but are negative. Denies any vaginal bleeding or discharge, diarrhea, vaginal or pelvic pain. No abdominal surgical history.   Patient is a 22 y.o. female presenting with abdominal pain.  Abdominal Pain Associated symptoms: nausea and vomiting   Associated symptoms: no chest pain, no diarrhea, no dysuria, no fever, no hematuria, no shortness of breath, no vaginal bleeding and no vaginal discharge     History reviewed. No pertinent past medical history. History reviewed. No pertinent past surgical history. No family history on file. History  Substance Use Topics  . Smoking status: Current Every Day Smoker  . Smokeless tobacco: Not on file  . Alcohol Use: No   OB History   Grav Para Term Preterm Abortions TAB SAB Ect Mult Living                 Review of Systems  Constitutional: Negative for fever.  Respiratory: Negative for shortness of breath.   Cardiovascular: Negative for chest pain.  Gastrointestinal: Positive for nausea, vomiting and abdominal pain. Negative for diarrhea.  Genitourinary: Positive for frequency and flank pain. Negative  for dysuria, hematuria, vaginal bleeding, vaginal discharge, vaginal pain and pelvic pain.    Allergies  Review of patient's allergies indicates no known allergies.  Home Medications   Current Outpatient Rx  Name  Route  Sig  Dispense  Refill  . ciprofloxacin (CIPRO) 500 MG tablet   Oral   Take 1 tablet (500 mg total) by mouth every 12 (twelve) hours.   20 tablet   0   . HYDROcodone-acetaminophen (NORCO/VICODIN) 5-325 MG per tablet   Oral   Take 1 tablet by mouth every 6 (six) hours as needed for severe pain.   8 tablet   0    BP 144/56  Pulse 107  Temp(Src) 97.4 F (36.3 C) (Oral)  Resp 18  SpO2 96%  LMP 08/21/2013 Physical Exam  Constitutional: She is oriented to person, place, and time. She appears well-developed and well-nourished. No distress.  Patient sleeping in bed upon my entrance into examination room.   HENT:  Head: Normocephalic and atraumatic.  Right Ear: External ear normal.  Left Ear: External ear normal.  Nose: Nose normal.  Eyes: Conjunctivae are normal.  Neck: Neck supple.  Cardiovascular: Normal rate, regular rhythm and normal heart sounds.   Pulmonary/Chest: Effort normal and breath sounds normal.  Abdominal: Soft. Bowel sounds are normal. She exhibits no distension. There is tenderness. There is CVA tenderness. There is no rigidity, no rebound and no guarding.    Musculoskeletal: She exhibits no edema.  Neurological: She is alert and oriented to person, place, and time.  Skin: Skin is warm and dry. She is not  diaphoretic.    ED Course  Procedures (including critical care time) Medications  HYDROcodone-acetaminophen (NORCO/VICODIN) 5-325 MG per tablet 1 tablet (1 tablet Oral Given 09/21/13 1700)  potassium chloride SA (K-DUR,KLOR-CON) CR tablet 40 mEq (40 mEq Oral Given 09/21/13 1704)    Labs Review Labs Reviewed  COMPREHENSIVE METABOLIC PANEL - Abnormal; Notable for the following:    Potassium 3.4 (*)    Glucose, Bld 69 (*)    GFR  calc non Af Amer 82 (*)    All other components within normal limits  URINALYSIS, ROUTINE W REFLEX MICROSCOPIC - Abnormal; Notable for the following:    Leukocytes, UA MODERATE (*)    All other components within normal limits  URINE MICROSCOPIC-ADD ON - Abnormal; Notable for the following:    Squamous Epithelial / LPF FEW (*)    All other components within normal limits  CBC WITH DIFFERENTIAL  LIPASE, BLOOD  PREGNANCY, URINE   Imaging Review No results found.  EKG Interpretation   None       MDM   1. UTI (urinary tract infection) with pyuria    Afebrile, NAD, non-toxic appearing, AAOx4. Patient is nontoxic, nonseptic appearing, in no apparent distress.  Patient's pain and other symptoms adequately managed in emergency department. PO fluids tolerated.  Labs and vitals reviewed.  Patient does not meet the SIRS or Sepsis criteria.  On repeat exam patient does not have a surgical abdomen and there are nor peritoneal signs.  No indication of appendicitis, bowel obstruction, bowel perforation, cholecystitis, diverticulitis, or ectopic pregnancy. Pt has been diagnosed with upper UTI. Pt is afebrile, w/ mild R sided CVA tenderness, normotensive, and no nausea or vomiting today or in ED. Pt to be dc home with antibiotics and instructions to follow up with PCP if symptoms persist. Return precautions discussed. Patient is agreeable to plan. Patient is stable at time of discharge      Jeannetta Ellis, PA-C 09/21/13 2356

## 2013-09-22 NOTE — ED Provider Notes (Signed)
Medical screening examination/treatment/procedure(s) were performed by non-physician practitioner and as supervising physician I was immediately available for consultation/collaboration.  Hurman Horn, MD 09/22/13 [phone]

## 2013-10-02 ENCOUNTER — Inpatient Hospital Stay (HOSPITAL_COMMUNITY)
Admission: AD | Admit: 2013-10-02 | Discharge: 2013-10-02 | Disposition: A | Discharge status: Home / Self Care | Payer: Medicaid Other | Source: Ambulatory Visit | Attending: Obstetrics & Gynecology | Admitting: Obstetrics & Gynecology

## 2013-10-02 ENCOUNTER — Other Ambulatory Visit (HOSPITAL_COMMUNITY): Payer: Medicaid Other

## 2013-10-02 ENCOUNTER — Encounter (HOSPITAL_COMMUNITY): Payer: Self-pay

## 2013-10-02 DIAGNOSIS — F17200 Nicotine dependence, unspecified, uncomplicated: Secondary | ICD-10-CM | POA: Insufficient documentation

## 2013-10-02 DIAGNOSIS — N912 Amenorrhea, unspecified: Secondary | ICD-10-CM | POA: Insufficient documentation

## 2013-10-02 DIAGNOSIS — N949 Unspecified condition associated with female genital organs and menstrual cycle: Secondary | ICD-10-CM | POA: Insufficient documentation

## 2013-10-02 DIAGNOSIS — R109 Unspecified abdominal pain: Secondary | ICD-10-CM | POA: Insufficient documentation

## 2013-10-02 DIAGNOSIS — N643 Galactorrhea not associated with childbirth: Secondary | ICD-10-CM | POA: Insufficient documentation

## 2013-10-02 HISTORY — DX: Urinary tract infection, site not specified: N39.0

## 2013-10-02 LAB — URINALYSIS, ROUTINE W REFLEX MICROSCOPIC
Hgb urine dipstick: NEGATIVE
Nitrite: NEGATIVE
Specific Gravity, Urine: 1.015 (ref 1.005–1.030)
Urobilinogen, UA: 0.2 mg/dL (ref 0.0–1.0)

## 2013-10-02 LAB — URINE MICROSCOPIC-ADD ON

## 2013-10-02 LAB — POCT PREGNANCY, URINE: Preg Test, Ur: NEGATIVE

## 2013-10-02 MED ORDER — PROMETHAZINE HCL 25 MG PO TABS
25.0000 mg | ORAL_TABLET | Freq: Once | ORAL | Status: AC
  Administered 2013-10-02: 25 mg via ORAL
  Filled 2013-10-02: qty 1

## 2013-10-02 MED ORDER — OXYCODONE-ACETAMINOPHEN 5-325 MG PO TABS
1.0000 | ORAL_TABLET | Freq: Once | ORAL | Status: AC
  Administered 2013-10-02: 1 via ORAL
  Filled 2013-10-02: qty 1

## 2013-10-02 NOTE — MAU Note (Addendum)
Patient is in with c/o lower abdominal pain that changes in intensity. She states that she "unsure pregnancy test" at home. lmp 10/17. She c/o breast tenderness. Denies vaginal bleeding or discharge. Patient wants a sreum pregnancy test.

## 2013-10-02 NOTE — MAU Provider Note (Signed)
History of Present Illness   Patient Identification Martha Sandoval is a 22 y.o. female.  Patient information was obtained from patient. History/Exam limitations: none. Patient presented to the MAU by private vehicle.  Chief Complaint  Abdominal Pain and Amenorrhea  Martha Sandoval is a 22 yof G0P0 who presents today with 2 wks of abdominal pain.  She states the pain is intermittent and sharp, in her RLQ.  She states she notices the pain while sitting still and has not noticed any alleviating factors.  The pain does not radiate and she reports feeling nauseated x3 wks without vomiting.  Pt states she was seen on 11/22 for the same pain in the ED and tx with abx for UTI.  Pt states she is currently taking abx, however still experiencing abd pain.  Pt states she believes she may be pregnant due to the fact that her LMP was appx 08/16/13, she is currently not using [facility] and has been sexually active with one partner.  She also reports breast tenderness and bilateral, milky galactorrhea in the past "several weeks".  She denies vaginal discharge/bleeding.    Past Medical History  Diagnosis Date  . UTI (lower urinary tract infection)    History reviewed. No pertinent family history.[phone]) No current facility-administered medications for this encounter.   No Known Allergies History   Social History  . Marital Status: Single    Spouse Name: N/A    Number of Children: N/A  . Years of Education: N/A   Occupational History  . Not on file.   Social History Main Topics  . Smoking status: Current Every Day Smoker  . Smokeless tobacco: Not on file  . Alcohol Use: Yes     Comment: occasionally drinks  . Drug Use: No  . Sexual Activity: Yes    Birth Control/ Protection: None     Comment: No birth control   Other Topics Concern  . Not on file   Social History Narrative  . No narrative on file   Review of Systems Pertinent items are noted in HPI.   Physical Exam   BP 114/68  Pulse 73   Temp(Src) 97.8 F (36.6 C) (Oral)  Resp 18  Ht 5\' 2"  (1.575 m)  Wt 128 lb 2 oz (58.117 kg)  BMI 23.43 kg/m2  SpO2 100%  LMP 08/16/2013 BP 114/68  Pulse 73  Temp(Src) 97.8 F (36.6 C) (Oral)  Resp 18  Ht 5\' 2"  (1.575 m)  Wt 128 lb 2 oz (58.117 kg)  BMI 23.43 kg/m2  SpO2 100%  LMP 08/16/2013 General appearance: alert, cooperative and no distress Lungs: clear to auscultation bilaterally Heart: regular rate and rhythm, S1, S2 normal, no murmur, click, rub or gallop Abdomen: Soft, tender in RLQ without rebound tenderness.  No masses/organomegaly noted.   Pelvic: cervix normal in appearance, external genitalia normal, no adnexal masses or tenderness, no cervical motion tenderness, rectovaginal septum normal, uterus normal size, shape, and consistency and vagina normal without discharge Skin: Skin color, texture, turgor normal. No rashes or lesions     Assessment/Plan   1. Abdominal pain/Pelvic pain/galactorrhea/amenorrhea - Urine pregnancy, UA, Vaginal wet prep, GC/Chlamydia, Pelvic US, CBC - Percocet 5mg  PO once for pain, Phenergan 25mg  PO once for nausea  **Pt received pain medication and left AMA prior to Pelvic US, CBC

## 2013-10-03 LAB — GC/CHLAMYDIA PROBE AMP
CT Probe RNA: NEGATIVE
GC Probe RNA: NEGATIVE

## 2013-10-08 NOTE — MAU Provider Note (Signed)
I agree with care plan Ferrel Simington, NP 

## 2013-10-18 ENCOUNTER — Encounter (HOSPITAL_COMMUNITY): Payer: Self-pay | Admitting: Emergency Medicine

## 2013-10-18 ENCOUNTER — Emergency Department (HOSPITAL_COMMUNITY)
Admission: EM | Admit: 2013-10-18 | Discharge: 2013-10-18 | Disposition: A | Discharge status: Home / Self Care | Payer: No Typology Code available for payment source | Attending: Emergency Medicine | Admitting: Emergency Medicine

## 2013-10-18 DIAGNOSIS — M546 Pain in thoracic spine: Secondary | ICD-10-CM | POA: Diagnosis present

## 2013-10-18 DIAGNOSIS — Y939 Activity, unspecified: Secondary | ICD-10-CM | POA: Insufficient documentation

## 2013-10-18 DIAGNOSIS — Y92410 Unspecified street and highway as the place of occurrence of the external cause: Secondary | ICD-10-CM | POA: Insufficient documentation

## 2013-10-18 DIAGNOSIS — S239XXA Sprain of unspecified parts of thorax, initial encounter: Secondary | ICD-10-CM | POA: Insufficient documentation

## 2013-10-18 DIAGNOSIS — M62838 Other muscle spasm: Secondary | ICD-10-CM | POA: Diagnosis not present

## 2013-10-18 DIAGNOSIS — S335XXA Sprain of ligaments of lumbar spine, initial encounter: Secondary | ICD-10-CM | POA: Insufficient documentation

## 2013-10-18 DIAGNOSIS — Z87442 Personal history of urinary calculi: Secondary | ICD-10-CM | POA: Insufficient documentation

## 2013-10-18 DIAGNOSIS — S39012A Strain of muscle, fascia and tendon of lower back, initial encounter: Secondary | ICD-10-CM

## 2013-10-18 DIAGNOSIS — F17200 Nicotine dependence, unspecified, uncomplicated: Secondary | ICD-10-CM | POA: Diagnosis not present

## 2013-10-18 DIAGNOSIS — M25519 Pain in unspecified shoulder: Secondary | ICD-10-CM | POA: Insufficient documentation

## 2013-10-18 DIAGNOSIS — S46812A Strain of other muscles, fascia and tendons at shoulder and upper arm level, left arm, initial encounter: Secondary | ICD-10-CM

## 2013-10-18 MED ORDER — METHOCARBAMOL 750 MG PO TABS: 750.0000 mg | ORAL_TABLET | Freq: Four times a day (QID) | ORAL | Status: DC | PRN

## 2013-10-18 MED ORDER — IBUPROFEN 800 MG PO TABS: 800.0000 mg | ORAL_TABLET | Freq: Three times a day (TID) | ORAL | Status: DC | PRN

## 2013-10-18 MED ORDER — IBUPROFEN 400 MG PO TABS
800.0000 mg | ORAL_TABLET | Freq: Once | ORAL | Status: AC
  Administered 2013-10-18: 800 mg via ORAL
  Filled 2013-10-18: qty 2

## 2013-10-18 NOTE — ED Notes (Signed)
C-Collar applied

## 2013-10-18 NOTE — ED Provider Notes (Signed)
Medical screening examination/treatment/procedure(s) were performed by non-physician practitioner and as supervising physician I was immediately available for consultation/collaboration.  EKG Interpretation   None         Kristen N Ward, DO 10/18/13 2331 
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
## 2014-04-29 ENCOUNTER — Inpatient Hospital Stay (HOSPITAL_COMMUNITY)
Admission: AD | Admit: 2014-04-29 | Discharge: 2014-04-30 | Disposition: A | Discharge status: Home / Self Care | Payer: Medicaid Other | Source: Ambulatory Visit | Attending: Obstetrics & Gynecology | Admitting: Obstetrics & Gynecology

## 2014-04-29 ENCOUNTER — Encounter (HOSPITAL_COMMUNITY): Payer: Self-pay

## 2014-04-29 ENCOUNTER — Inpatient Hospital Stay (HOSPITAL_COMMUNITY): Payer: Medicaid Other

## 2014-04-29 DIAGNOSIS — R519 Headache, unspecified: Secondary | ICD-10-CM

## 2014-04-29 DIAGNOSIS — R51 Headache: Secondary | ICD-10-CM | POA: Insufficient documentation

## 2014-04-29 DIAGNOSIS — N912 Amenorrhea, unspecified: Secondary | ICD-10-CM | POA: Insufficient documentation

## 2014-04-29 DIAGNOSIS — R109 Unspecified abdominal pain: Secondary | ICD-10-CM | POA: Insufficient documentation

## 2014-04-29 DIAGNOSIS — N643 Galactorrhea not associated with childbirth: Secondary | ICD-10-CM | POA: Insufficient documentation

## 2014-04-29 DIAGNOSIS — F17200 Nicotine dependence, unspecified, uncomplicated: Secondary | ICD-10-CM | POA: Insufficient documentation

## 2014-04-29 LAB — CBC
HCT: 37 % (ref 36.0–46.0)
Hemoglobin: 13.1 g/dL (ref 12.0–15.0)
MCH: 32.1 pg (ref 26.0–34.0)
MCHC: 35.4 g/dL (ref 30.0–36.0)
MCV: 90.7 fL (ref 78.0–100.0)
Platelets: 248 10*3/uL (ref 150–400)
RBC: 4.08 MIL/uL (ref 3.87–5.11)
RDW: 12.8 % (ref 11.5–15.5)
WBC: 8.7 10*3/uL (ref 4.0–10.5)

## 2014-04-29 LAB — BASIC METABOLIC PANEL
BUN: 14 mg/dL (ref 6–23)
CHLORIDE: 103 meq/L (ref 96–112)
CO2: 27 mEq/L (ref 19–32)
Calcium: 9.7 mg/dL (ref 8.4–10.5)
Creatinine, Ser: 0.91 mg/dL (ref 0.50–1.10)
GFR calc Af Amer: >90 mL/min (ref >90)
GFR, EST NON AFRICAN AMERICAN: 89 mL/min — AB (ref >90)
GLUCOSE: 103 mg/dL — AB (ref 70–99)
POTASSIUM: 3.9 meq/L (ref 3.7–5.3)
Sodium: 141 mEq/L (ref 137–147)

## 2014-04-29 LAB — URINALYSIS, ROUTINE W REFLEX MICROSCOPIC
BILIRUBIN URINE: NEGATIVE
GLUCOSE, UA: NEGATIVE mg/dL
Hgb urine dipstick: NEGATIVE
Ketones, ur: NEGATIVE mg/dL
LEUKOCYTES UA: NEGATIVE
Nitrite: NEGATIVE
PROTEIN: NEGATIVE mg/dL
Specific Gravity, Urine: 1.02 (ref 1.005–1.030)
Urobilinogen, UA: 0.2 mg/dL (ref 0.0–1.0)
pH: 6 (ref 5.0–8.0)

## 2014-04-29 LAB — POCT PREGNANCY, URINE: Preg Test, Ur: NEGATIVE

## 2014-04-29 NOTE — MAU Provider Note (Signed)
History     CSN: [id number]  Arrival date and time: 04/29/14 1944   None     Chief Complaint  Patient presents with  . Possible Pregnancy  . Emesis  . Abdominal Pain   Possible Pregnancy Associated symptoms include abdominal pain, headaches, nausea and vomiting. Pertinent negatives include no chills or fever.  Emesis  Associated symptoms include abdominal pain, headaches and weight loss. Pertinent negatives include no chills or fever.  Abdominal Pain Associated symptoms include headaches, nausea, vomiting and weight loss. Pertinent negatives include no fever.    Pt is a G0P0 here with report of having headaches for past 6 days.  Reports frontal headache that starts upon waking and lasts until [facility] wears off.  Also reports no menstrual cycle since April 3015.  Also reports bilateral leaking of clear fluid from breasts off and on for approximately History of regular cycles.  Midpelvic pain x one month.  Pain is described as a sharp intermittent pain that lasts for a few minutes.  Denies abnormal discharge or odor.  +nausea x past month with first incidence of vomiting today.  Reports only being able to eat crackers or bread for the past month.  Inquired regarding new stressors:  Reports getting laid off of job in March.     Past Medical History  Diagnosis Date  . UTI (lower urinary tract infection)     History reviewed. No pertinent past surgical history.  History reviewed. No pertinent family history.  History  Substance Use Topics  . Smoking status: Current Every Day Smoker  . Smokeless tobacco: Not on file  . Alcohol Use: Yes     Comment: occasionally drinks    Allergies: No Known Allergies  Prescriptions prior to admission  Medication Sig Dispense Refill  . Aspirin-Acetaminophen-Caffeine (GOODY HEADACHE PO) Take 1 packet by mouth daily as needed (For migraine.).        Review of Systems  Constitutional: Positive for weight loss. Negative for fever and  chills.  Gastrointestinal: Positive for nausea, vomiting and abdominal pain.  Neurological: Positive for headaches.   Physical Exam   Blood pressure 128/68, pulse 65, temperature 98.5 F (36.9 C), temperature source Oral, resp. rate 16, height 5' 3.5" (1.613 m), weight 58.571 kg (129 lb 2 oz), last menstrual period 02/15/2014, SpO2 100.00%.  Physical Exam  Constitutional: She is oriented to person, place, and time. She appears well-developed and well-nourished. No distress.  HENT:  Head: Normocephalic.  Eyes: EOM are normal. Pupils are equal, round, and reactive to light.  Neck: Normal range of motion. Neck supple.  Cardiovascular: Normal rate, regular rhythm and normal heart sounds.   Respiratory: Effort normal and breath sounds normal. Right breast exhibits no nipple discharge and no tenderness. Left breast exhibits no nipple discharge and no tenderness.  GI: Soft. She exhibits mass. There is tenderness.  Genitourinary: Uterus is enlarged. Right adnexum displays no mass. Left adnexum displays no mass.  Neurological: She is alert and oriented to person, place, and time. She has normal reflexes.  Skin: Skin is warm and dry.    MAU Course  Procedures  Ultrasound: Measurements: 3.8 x 2.6 x 2.6 cm. There is no mass. There are  peripheral similarly sized follicles, none of which appear dominant.  Left ovary  Measurements: 3.6 x 1.9 x 1.8 cm. There is no mass. There are  peripheral similarly sized follicles, none of which appear dominant.  Other findings: Small volume free pelvic fluid which is simple.  IMPRESSION:  1. No acute pelvic findings.  2. Ovarian appearance which can be seen with polycystic ovarian  syndrome. Given history of amenorrhea, suggest further clinical  evaluation.   Assessment and Plan  Headaches Amenorrhea Galactorrhea  Plan: TSH, Prolactin pending [facility] RX Flexeril  [facility] LtdMUHAMMAD,WALIDAH 04/29/2014, 10:08 PM

## 2014-04-29 NOTE — MAU Note (Signed)
Pt reports for the past 6 days she has had a migraine, no period x 2 months, lower abd pain, nausea and vomiting.

## 2014-04-30 DIAGNOSIS — N912 Amenorrhea, unspecified: Secondary | ICD-10-CM

## 2014-04-30 LAB — AMYLASE: Amylase: 51 U/L (ref 0–105)

## 2014-04-30 LAB — TSH: TSH: 0.764 u[IU]/mL (ref 0.350–4.500)

## 2014-04-30 LAB — PROLACTIN: PROLACTIN: 12.2 ng/mL

## 2014-04-30 LAB — LIPASE, BLOOD: Lipase: 33 U/L (ref 11–59)

## 2014-04-30 MED ORDER — CYCLOBENZAPRINE HCL 10 MG PO TABS: 10.0000 mg | ORAL_TABLET | Freq: Three times a day (TID) | ORAL | Status: AC | PRN

## 2014-04-30 MED ORDER — HYDROMORPHONE HCL 2 MG PO TABS
2.0000 mg | ORAL_TABLET | Freq: Once | ORAL | Status: AC
  Administered 2014-04-30: 2 mg via ORAL
  Filled 2014-04-30: qty 1

## 2014-05-05 ENCOUNTER — Telehealth: Payer: Self-pay | Admitting: *Deleted

## 2014-05-05 NOTE — Telephone Encounter (Signed)
Martha Sandoval called and left a message requesting test results. Called Martha Sandoval and gave her results and reccomendation she follow up with Family Medicine .  She has already called and made appointment.

## 2014-09-07 ENCOUNTER — Emergency Department (HOSPITAL_COMMUNITY)
Admission: EM | Admit: 2014-09-07 | Discharge: 2014-09-07 | Disposition: A | Discharge status: Home / Self Care | Payer: No Typology Code available for payment source | Attending: Emergency Medicine | Admitting: Emergency Medicine

## 2014-09-07 ENCOUNTER — Encounter (HOSPITAL_COMMUNITY): Payer: Self-pay | Admitting: Emergency Medicine

## 2014-09-07 DIAGNOSIS — Z87440 Personal history of urinary (tract) infections: Secondary | ICD-10-CM | POA: Insufficient documentation

## 2014-09-07 DIAGNOSIS — S61012A Laceration without foreign body of left thumb without damage to nail, initial encounter: Secondary | ICD-10-CM | POA: Insufficient documentation

## 2014-09-07 DIAGNOSIS — Z87891 Personal history of nicotine dependence: Secondary | ICD-10-CM | POA: Insufficient documentation

## 2014-09-07 DIAGNOSIS — Y998 Other external cause status: Secondary | ICD-10-CM | POA: Insufficient documentation

## 2014-09-07 DIAGNOSIS — W452XXA Lid of can entering through skin, initial encounter: Secondary | ICD-10-CM | POA: Insufficient documentation

## 2014-09-07 DIAGNOSIS — Y93G3 Activity, cooking and baking: Secondary | ICD-10-CM | POA: Insufficient documentation

## 2014-09-07 DIAGNOSIS — Y929 Unspecified place or not applicable: Secondary | ICD-10-CM | POA: Insufficient documentation

## 2014-09-07 MED ORDER — HYDROCODONE-ACETAMINOPHEN 5-325 MG PO TABS: 1.0000 | ORAL_TABLET | Freq: Four times a day (QID) | ORAL | Status: DC | PRN

## 2014-09-07 MED ORDER — LIDOCAINE HCL (PF) 1 % IJ SOLN
5.0000 mL | Freq: Once | INTRAMUSCULAR | Status: DC
  Filled 2014-09-07: qty 5

## 2014-09-07 MED ORDER — OXYCODONE-ACETAMINOPHEN 5-325 MG PO TABS: 1.0000 | ORAL_TABLET | Freq: Once | ORAL | Status: DC

## 2014-09-07 MED ORDER — BUPIVACAINE HCL (PF) 0.25 % IJ SOLN
10.0000 mL | INTRAMUSCULAR | Status: AC
  Administered 2014-09-07: 10 mL
  Filled 2014-09-07: qty 10

## 2014-09-07 MED ORDER — BUPIVACAINE HCL 0.25 % IJ SOLN
10.0000 mL | Freq: Once | INTRAMUSCULAR | Status: DC
  Filled 2014-09-07: qty 10

## 2014-09-07 NOTE — ED Provider Notes (Signed)
CSN: [id number]     Arrival date & time 09/07/14  2140 History  This chart was scribed for non-physician practitioner, Jaynie Crumbleatyana Eryx Zane, PA-C,working with Gerhard Munchobert Lockwood, MD, by Karle PlumberJennifer Tensley, ED Scribe. This patient was seen in room TR08C/TR08C and the patient's care was started at 9:56 PM.  Chief Complaint  Patient presents with  . Extremity Laceration   The history is provided by the patient. No language interpreter was used.    HPI Comments:  Martha AddisonHydeia Sandoval is a 23 y.o. female who presents to the Emergency Department complaining of a laceration to her left thumb that occurred PTA. She states she was cooking an cut the thumb on the top of a can. She reports severe pain and moderate bleeding that is not controlled. She states moving or touching the area makes the pain worse. She denies any alleviating factors. She has not done anything to treat the injury. Denies numbness, tingling or weakness of the thumb. She states her tetanus is UTD.  Past Medical History  Diagnosis Date  . UTI (lower urinary tract infection)    History reviewed. No pertinent past surgical history. History reviewed. No pertinent family history. History  Substance Use Topics  . Smoking status: Former Games developermoker  . Smokeless tobacco: Not on file  . Alcohol Use: Yes     Comment: occasionally drinks   OB History    Gravida Para Term Preterm AB TAB SAB Ectopic Multiple Living   0              Review of Systems  Gastrointestinal: Negative for nausea and vomiting.  Skin: Positive for wound.  Neurological: Negative for weakness and numbness.    Allergies  Review of patient's allergies indicates no known allergies.  Home Medications   Prior to Admission medications   Medication Sig Start Date End Date Taking? Authorizing Provider  cyclobenzaprine (FLEXERIL) 10 MG tablet Take 1 tablet (10 mg total) by mouth 3 (three) times daily as needed. 04/30/14   Marlis EdelsonWalidah N Karim, CNM   Triage Vitals: BP 118/58 mmHg   Pulse 102  Temp(Src) 98.2 F (36.8 C) (Oral)  Resp 22  Ht 5\' 3"  (1.6 m)  Wt 130 lb (58.968 kg)  BMI 23.03 kg/m2  SpO2 100%  LMP 07/30/2014 (Approximate) Physical Exam  Constitutional: She is oriented to person, place, and time. She appears well-developed and well-nourished.  HENT:  Head: Normocephalic and atraumatic.  Eyes: EOM are normal.  Neck: Normal range of motion.  Cardiovascular: Normal rate.   Pulmonary/Chest: Effort normal.  Musculoskeletal: Normal range of motion.  Full rom of the left thumb, good strength against resistance with flexion and extension at MCP and IP joints. Cap refill <2sec distally.    Neurological: She is alert and oriented to person, place, and time.  Skin: Skin is warm and dry.  2 cm flap laceration to the distal left thumb over palmar surface  Psychiatric: She has a normal mood and affect. Her behavior is normal.  Nursing note and vitals reviewed.   ED Course  Procedures (including critical care time) DIAGNOSTIC STUDIES: Oxygen Saturation is 100% on RA, normal by my interpretation.   COORDINATION OF CARE: 10:01 PM- Will suture laceration. Pt verbalizes understanding and agrees to plan.  LACERATION REPAIR PROCEDURE NOTE The patient's identification was confirmed and consent was obtained. This procedure was performed by Jaynie Crumbleatyana Paden Senger, PA-C at 10:01 PM. Site: palmar surface of distal thumb Sterile procedures observed Anesthetic used (type and amt): Bupivicaine 0.25% without Epinephrine (4 mLs)  Suture type/size: 5-0 Prolene Length: 2 cm # of Sutures: 8 Technique: simple, interrupted Complexity: simple Antibx ointment applied Tetanus UTD Site anesthetized, irrigated with NS, explored without evidence of foreign body, wound well approximated, site covered with dry, sterile dressing.  Patient tolerated procedure well without complications. Instructions for care discussed verbally and patient provided with additional written instructions  for homecare and f/u.  Medications  bupivacaine (PF) (MARCAINE) 0.25 % injection 10 mL (not administered)   Labs Review Labs Reviewed - No data to display  Imaging Review No results found.   EKG Interpretation None      MDM   Final diagnoses:  Laceration of left thumb, initial encounter    Pt with laceration to the distal left thumb. No evidence of tendon, nerve injury. laceration repaired. Home with wound care, norco per request, follow up as needed and for suture removal.   Filed Vitals:   09/07/14 2208 09/07/14 2246  BP: 118/58 98/60  Pulse: 102 76  Temp: 98.2 F (36.8 C) 98 F (36.7 C)  TempSrc: Oral Oral  Resp: 22 20  Height: 5\' 3"  (1.6 m)   Weight: 130 lb (58.968 kg)   SpO2: 100% 100%     I personally performed the services described in this documentation, which was scribed in my presence. The recorded information has been reviewed and is accurate.    Lottie Musselatyana A Hersey Maclellan, PA-C 09/07/14 2308  Gerhard Munchobert Lockwood, MD 09/07/14 2314

## 2014-09-07 NOTE — ED Notes (Signed)
Patient reports she was cooking and cut herself with can. 3 cm wide on left thumb.  Patient reports she last had wendy's about 4 hours ago. She reports taking a tetanus shot this past June.

## 2014-09-07 NOTE — ED Notes (Signed)
Dry gauze placed over left thumb over suturing.

## 2014-09-07 NOTE — ED Notes (Signed)
Pharmacy called for bupivacaine

## 2014-09-07 NOTE — Discharge Instructions (Signed)
Keep your cut clean and dry. Bacitracin twice a day. Ibuprofen for pain. Norco for severe pain only. Follow up with primary care doctor or here for suture removal in 7 days  Laceration Care, Adult A laceration is a cut or lesion that goes through all layers of the skin and into the tissue just beneath the skin. TREATMENT  Some lacerations may not require closure. Some lacerations may not be able to be closed due to an increased risk of infection. It is important to see your caregiver as soon as possible after an injury to minimize the risk of infection and maximize the opportunity for successful closure. If closure is appropriate, pain medicines may be given, if needed. The wound will be cleaned to help prevent infection. Your caregiver will use stitches (sutures), staples, wound glue (adhesive), or skin adhesive strips to repair the laceration. These tools bring the skin edges together to allow for faster healing and a better cosmetic outcome. However, all wounds will heal with a scar. Once the wound has healed, scarring can be minimized by covering the wound with sunscreen during the day for 1 full year. HOME CARE INSTRUCTIONS  For sutures or staples:  Keep the wound clean and dry.  If you were given a bandage (dressing), you should change it at least once a day. Also, change the dressing if it becomes wet or dirty, or as directed by your caregiver.  Wash the wound with soap and water 2 times a day. Rinse the wound off with water to remove all soap. Pat the wound dry with a clean towel.  After cleaning, apply a thin layer of the antibiotic ointment as recommended by your caregiver. This will help prevent infection and keep the dressing from sticking.  You may shower as usual after the first 24 hours. Do not soak the wound in water until the sutures are removed.  Only take over-the-counter or prescription medicines for pain, discomfort, or fever as directed by your caregiver.  Get your sutures  or staples removed as directed by your caregiver. For skin adhesive strips:  Keep the wound clean and dry.  Do not get the skin adhesive strips wet. You may bathe carefully, using caution to keep the wound dry.  If the wound gets wet, pat it dry with a clean towel.  Skin adhesive strips will fall off on their own. You may trim the strips as the wound heals. Do not remove skin adhesive strips that are still stuck to the wound. They will fall off in time. For wound adhesive:  You may briefly wet your wound in the shower or bath. Do not soak or scrub the wound. Do not swim. Avoid periods of heavy perspiration until the skin adhesive has fallen off on its own. After showering or bathing, gently pat the wound dry with a clean towel.  Do not apply liquid medicine, cream medicine, or ointment medicine to your wound while the skin adhesive is in place. This may loosen the film before your wound is healed.  If a dressing is placed over the wound, be careful not to apply tape directly over the skin adhesive. This may cause the adhesive to be pulled off before the wound is healed.  Avoid prolonged exposure to sunlight or tanning lamps while the skin adhesive is in place. Exposure to ultraviolet light in the first year will darken the scar.  The skin adhesive will usually remain in place for 5 to 10 days, then naturally fall off the  skin. Do not pick at the adhesive film. You may need a tetanus shot if:  You cannot remember when you had your last tetanus shot.  You have never had a tetanus shot. If you get a tetanus shot, your arm may swell, get red, and feel warm to the touch. This is common and not a problem. If you need a tetanus shot and you choose not to have one, there is a rare chance of getting tetanus. Sickness from tetanus can be serious. SEEK MEDICAL CARE IF:   You have redness, swelling, or increasing pain in the wound.  You see a red line that goes away from the wound.  You have  yellowish-white fluid (pus) coming from the wound.  You have a fever.  You notice a bad smell coming from the wound or dressing.  Your wound breaks open before or after sutures have been removed.  You notice something coming out of the wound such as wood or glass.  Your wound is on your hand or foot and you cannot move a finger or toe. SEEK IMMEDIATE MEDICAL CARE IF:   Your pain is not controlled with prescribed medicine.  You have severe swelling around the wound causing pain and numbness or a change in color in your arm, hand, leg, or foot.  Your wound splits open and starts bleeding.  You have worsening numbness, weakness, or loss of function of any joint around or beyond the wound.  You develop painful lumps near the wound or on the skin anywhere on your body. MAKE SURE YOU:   Understand these instructions.  Will watch your condition.  Will get help right away if you are not doing well or get worse. Document Released: 10/17/2005 Document Revised: 01/09/2012 Document Reviewed: 04/12/2011 [facility] Patient Information 2015 [employer] [place]. This information is not intended to replace advice given to you by your health care provider. Make sure you discuss any questions you have with your health care provider.

## 2014-09-07 NOTE — ED Notes (Signed)
Tatyana, PA-C, gives verbal order to cancel percocet. Present at the bedside for suturing.

## 2014-10-08 ENCOUNTER — Inpatient Hospital Stay (HOSPITAL_COMMUNITY)
Admission: AD | Admit: 2014-10-08 | Discharge: 2014-10-08 | Disposition: A | Discharge status: Home / Self Care | Payer: Medicaid Other | Source: Ambulatory Visit | Attending: Obstetrics & Gynecology | Admitting: Obstetrics & Gynecology

## 2014-10-08 DIAGNOSIS — Z711 Person with feared health complaint in whom no diagnosis is made: Secondary | ICD-10-CM | POA: Insufficient documentation

## 2014-10-08 DIAGNOSIS — T7421XA Adult sexual abuse, confirmed, initial encounter: Secondary | ICD-10-CM

## 2014-10-08 LAB — POCT PREGNANCY, URINE: Preg Test, Ur: NEGATIVE
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
## 2014-10-08 NOTE — Discharge Instructions (Signed)
Sexual Assault or Rape °Sexual assault is any sexual activity that a person is forced, threatened, or coerced into participating in. It may or may not involve physical contact. You are being sexually abused if you are forced to have sexual contact of any kind. Sexual assault is called rape if penetration has occurred (vaginal, oral, or anal). Many times, sexual assaults are committed by a friend, relative, or associate. Sexual assault and rape are never the victim's fault.  °Sexual assault can result in various health problems for the person who was assaulted. Some of these problems include: °· Physical injuries in the genital area or other areas of the body. °· Risk of unwanted pregnancy. °· Risk of sexually transmitted infections (STIs). °· Psychological problems such as anxiety, depression, or posttraumatic stress disorder. °WHAT STEPS SHOULD BE TAKEN AFTER A SEXUAL ASSAULT? °If you have been sexually assaulted, you should take the following steps as soon as possible: °· Go to a safe area as quickly as possible and call your local emergency services (911 in [place] Get away from the area where you have been attacked.   °· Do not wash, shower, comb your hair, or clean any part of your body.   °· Do not change your clothes.   °· Do not remove or touch anything in the area where you were assaulted.   °· Go to an emergency room for a complete physical exam. Get the necessary tests to protect yourself from STIs or pregnancy. You may be treated for an STI even if no signs of one are present. Emergency contraceptive medicines are also available to help prevent pregnancy, if this is desired. You may need to be examined by a specially trained health care provider. °· Have the health care provider collect evidence during the exam, even if you are not sure if you will file a report with the police. °· Find out how to file the correct papers with the authorities. This is important for all assaults, even if they were committed  by a family member or friend. °· Find out where you can get additional help and support, such as a local rape crisis center. °· Follow up with your health care provider as directed.   °HOW CAN YOU REDUCE THE CHANCES OF SEXUAL ASSAULT? °Take the following steps to help reduce your chances of being sexually assaulted: °· Consider carrying mace or pepper spray for protection against an attacker.   °· Consider taking a self-defense course. °· Do not try to fight off an attacker if he or she has a gun or knife.   °· Be aware of your surroundings, what is happening around you, and who might be there.   °· Be assertive, trust your instincts, and walk with confidence and direction. °· Be careful not to drink too much alcohol or use other intoxicants. These can reduce your ability to fight off an assault. °· Always lock your doors and windows. Be sure to have high-quality locks for your home.   °· Do not let people enter your house if you do not know them.   °· Get a home security system that has a siren if you are able.   °· Protect the keys to your house and car. Do not lend them out. Do not put your name and address on them. If you lose them, get your locks changed.   °· Always lock your car and have your key ready to open the door before approaching the car.   °· Park in a well-lit and busy area. °· Plan your driving routes   so that you travel on well-lit and frequently used streets.  Keep your car serviced. Always have at least half a tank of gas in it.   Do not go into isolated areas alone. This includes open garages, empty buildings or offices, or [employer] rooms.   Do not walk or jog alone, especially when it is dark.   Never hitchhike.   If your car breaks down, call the police for help on your cell phone and stay inside the car with your doors locked and windows up.   If you are being followed, go to a busy area and call for help.   If you are stopped by a police officer, especially one in  an unmarked police car, keep your door locked. Do not put your window down all the way. Ask the officer to show you identification first.   Be aware of "date rape drugs" that can be placed in a drink when you are not looking. These drugs can make you unable to fight off an assault. FOR MORE INFORMATION  Office on [employer], [place] Department of Health and Human Services: Secretary[employer].cawww.womenshealth.gov/violence-against-women/types-of-violence/sexual-assault-and-abuse.html  National Sexual Assault Hotline: 1-800-656-HOPE 581-562-4107(4673)  National Domestic Violence Hotline: 1-800-799-SAFE 3318878606(7233) or www.thehotline.org Document Released: 10/14/2000 Document Revised: 06/19/2013 Document Reviewed: 03/20/2013 [facility] Patient Information 2015 [employer], [place]. This information is not intended to replace advice given to you by your health care provider. Make sure you discuss any questions you have with your health care provider.   Ad[facility] to [facility] asap

## 2015-03-26 ENCOUNTER — Encounter (HOSPITAL_COMMUNITY): Payer: Self-pay | Admitting: *Deleted

## 2015-03-26 ENCOUNTER — Emergency Department (HOSPITAL_COMMUNITY)
Admission: EM | Admit: 2015-03-26 | Discharge: 2015-03-27 | Discharge status: Against Medical Advice | Payer: Medicaid Other | Attending: Emergency Medicine | Admitting: Emergency Medicine

## 2015-03-26 DIAGNOSIS — M79674 Pain in right toe(s): Secondary | ICD-10-CM | POA: Insufficient documentation

## 2015-03-26 DIAGNOSIS — M25474 Effusion, right foot: Secondary | ICD-10-CM | POA: Insufficient documentation

## 2015-03-26 NOTE — ED Notes (Signed)
Pain , swelling to MP joint of rt great toe.  Sudden onset and no known injury

## 2015-05-10 IMAGING — US US TRANSVAGINAL NON-OB
1 series · 13 of 25 positions shown · non-contrast
Comparison: None available

CLINICAL DATA: Pelvic pain.  Amenorrhea.

EXAM:
TRANSABDOMINAL AND TRANSVAGINAL ULTRASOUND OF PELVIS
TECHNIQUE: Both transabdominal and transvaginal ultrasound examinations of the
pelvis were performed. Transabdominal technique was performed for
global imaging of the pelvis including uterus, ovaries, adnexal
regions, and pelvic cul-de-sac. It was necessary to proceed with
endovaginal exam following the transabdominal exam to visualize the
ovaries and endometrium.

[Series 1: us pelvis complete · 13 of 49 slices shown]
[im 1/49]
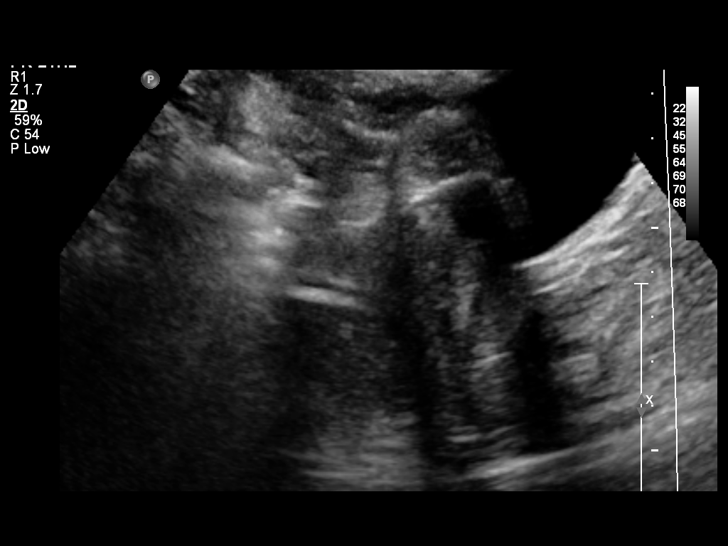
[im 5/49]
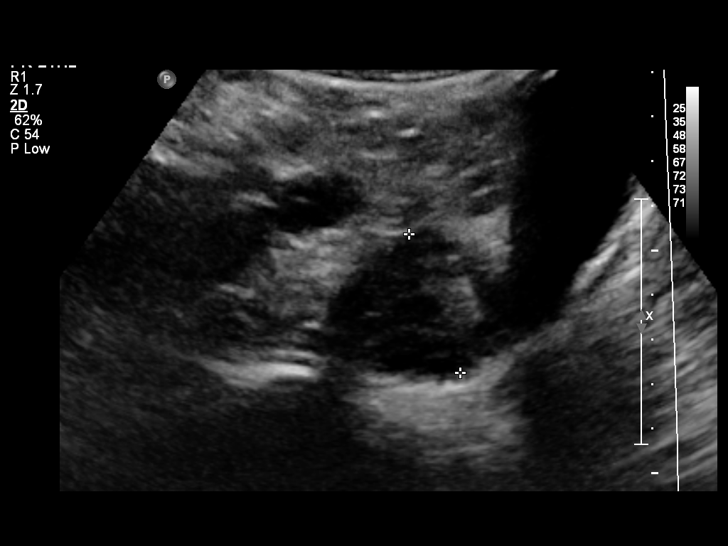
[im 9/49]
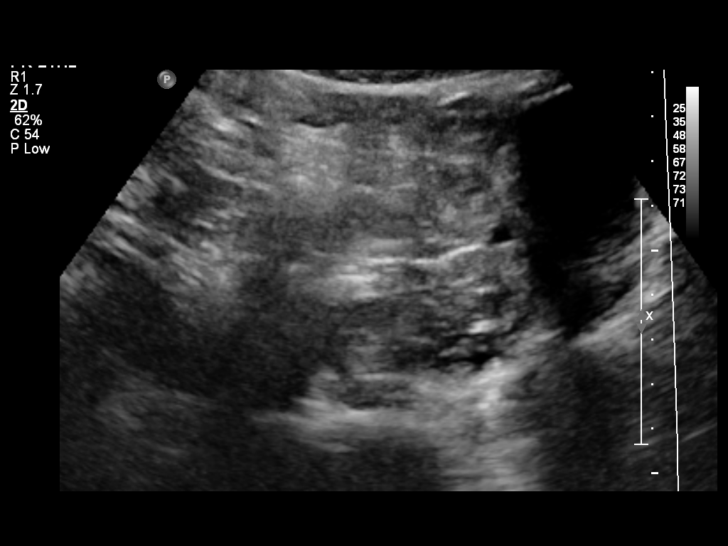
[im 13/49]
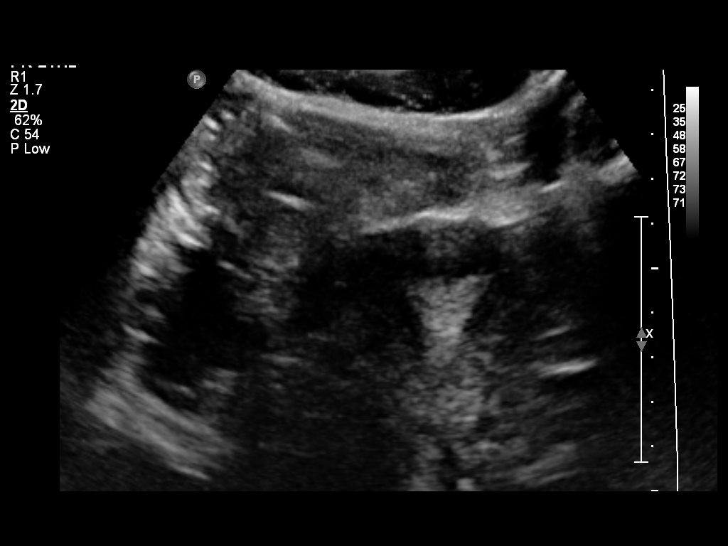
[im 17/49]
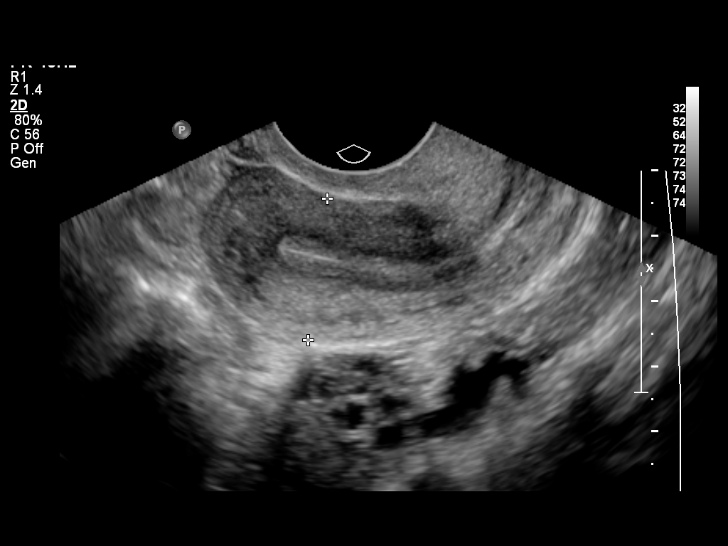
[im 21/49]
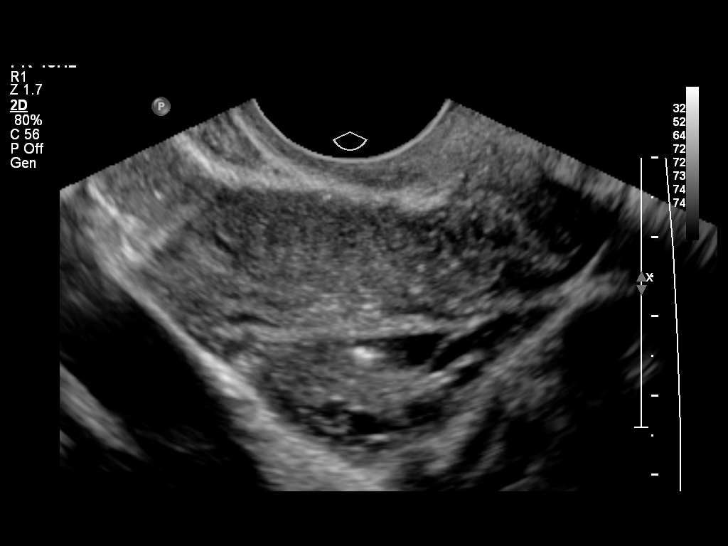
[im 25/49]
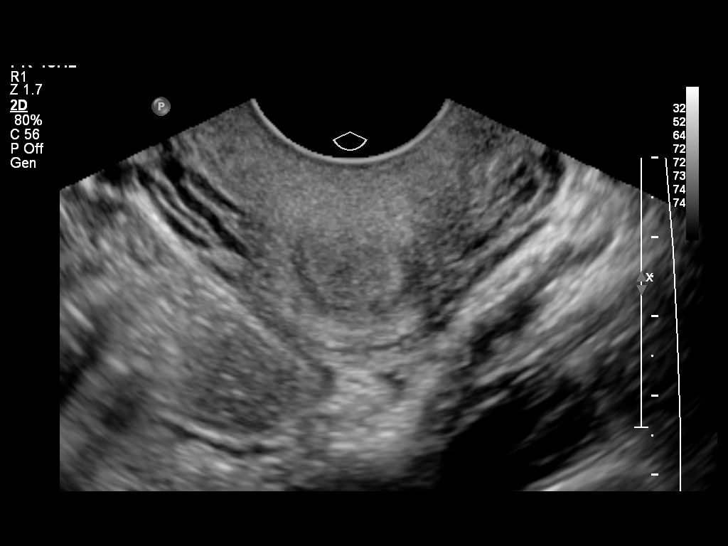
[im 29/49]
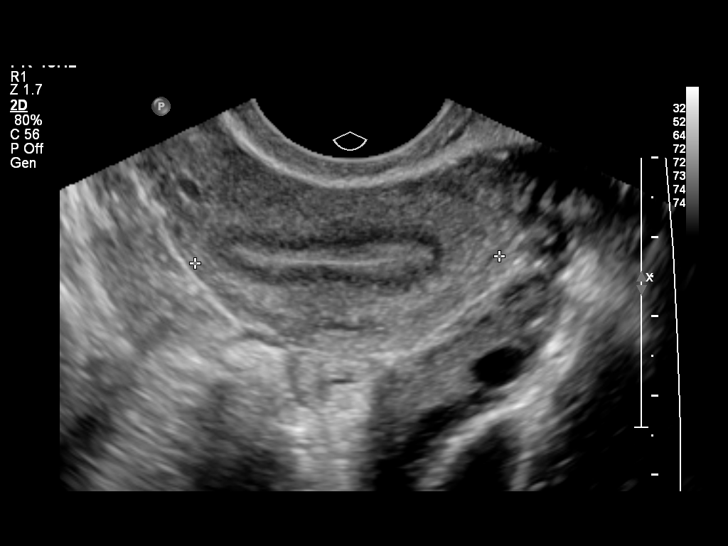
[im 33/49]
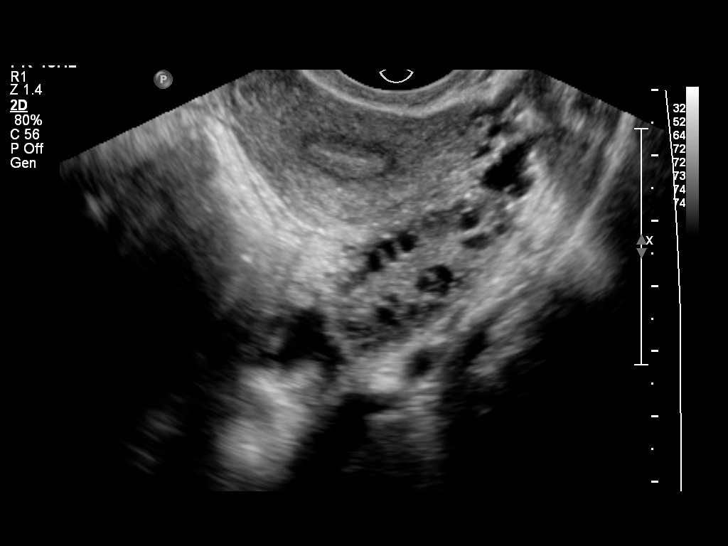
[im 37/49]
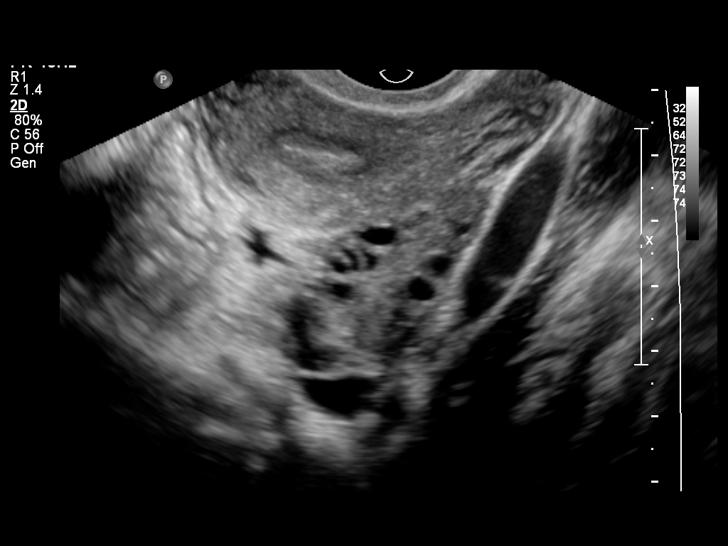
[im 41/49]
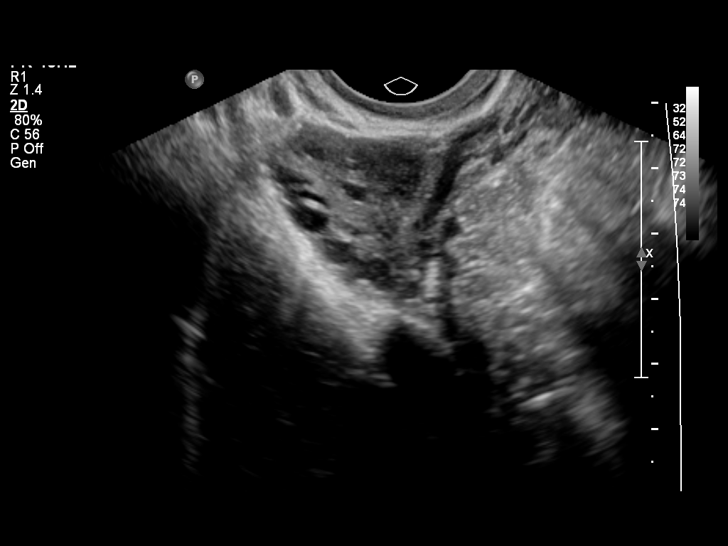
[im 45/49]
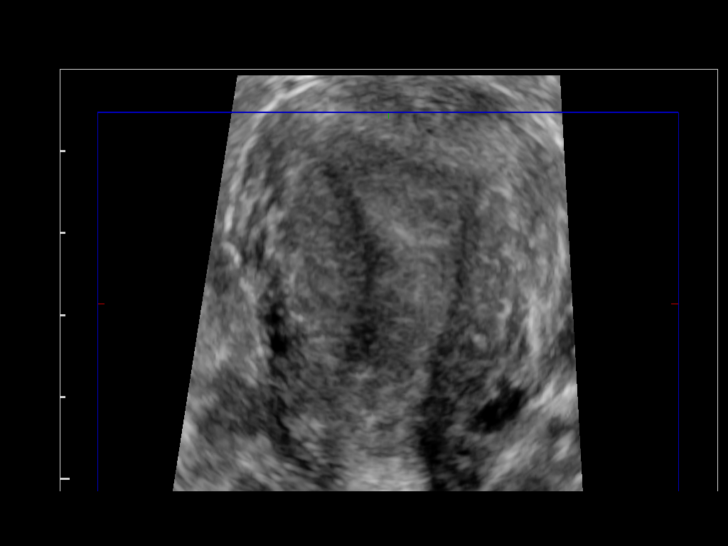
[im 49/49]
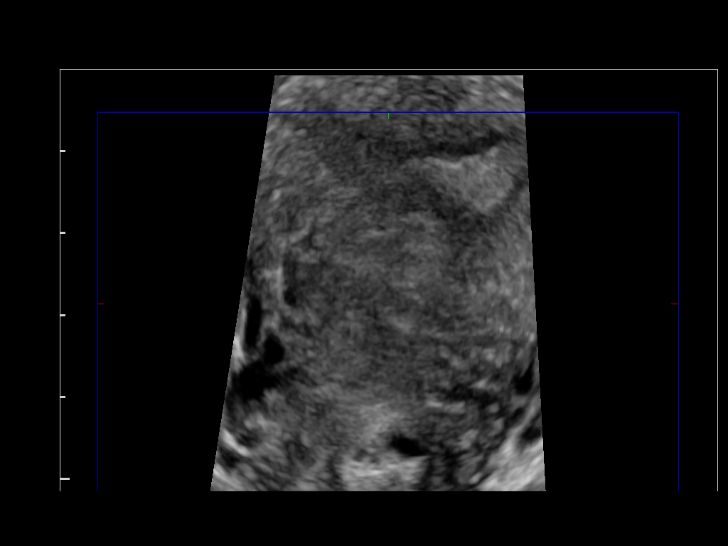

[13 of 25 positions shown; findings below may reference images not displayed]

FINDINGS: Uterus

Measurements: 7 x 2 x 4 cm. No fibroids or other mass visualized.

Endometrium

Thickness: 5 cm.  No focal abnormality visualized.

Right ovary

Measurements: 3.8 x 2.6 x 2.6 cm. There is no mass. There are
peripheral similarly sized follicles, none of which appear dominant.

Left ovary

Measurements: 3.6 x 1.9 x 1.8 cm. There is no mass. There are
peripheral similarly sized follicles, none of which appear dominant.

Other findings:  Small volume free pelvic fluid which is simple.
IMPRESSION: 1. No acute pelvic findings.
2. Ovarian appearance which can be seen with polycystic ovarian
syndrome. Given history of amenorrhea, suggest further clinical
evaluation.

## 2015-08-29 ENCOUNTER — Encounter (HOSPITAL_COMMUNITY): Payer: Self-pay | Admitting: Emergency Medicine

## 2015-08-29 ENCOUNTER — Emergency Department (HOSPITAL_COMMUNITY)
Admission: EM | Admit: 2015-08-29 | Discharge: 2015-08-29 | Disposition: A | Discharge status: Home / Self Care | Payer: 59 | Attending: Emergency Medicine | Admitting: Emergency Medicine

## 2015-08-29 DIAGNOSIS — M62838 Other muscle spasm: Secondary | ICD-10-CM

## 2015-08-29 DIAGNOSIS — M436 Torticollis: Secondary | ICD-10-CM | POA: Insufficient documentation

## 2015-08-29 DIAGNOSIS — Z87891 Personal history of nicotine dependence: Secondary | ICD-10-CM | POA: Insufficient documentation

## 2015-08-29 DIAGNOSIS — M542 Cervicalgia: Secondary | ICD-10-CM | POA: Diagnosis present

## 2015-08-29 DIAGNOSIS — Z87440 Personal history of urinary (tract) infections: Secondary | ICD-10-CM | POA: Diagnosis not present

## 2015-08-29 MED ORDER — IBUPROFEN 600 MG PO TABS: 600.0000 mg | ORAL_TABLET | Freq: Four times a day (QID) | ORAL | Status: DC | PRN

## 2015-08-29 MED ORDER — DIAZEPAM 5 MG/ML IJ SOLN
5.0000 mg | Freq: Once | INTRAMUSCULAR | Status: AC
  Administered 2015-08-29: 5 mg via INTRAMUSCULAR
  Filled 2015-08-29: qty 2

## 2015-08-29 MED ORDER — DIAZEPAM 5 MG PO TABS: 5.0000 mg | ORAL_TABLET | Freq: Four times a day (QID) | ORAL | Status: AC | PRN

## 2015-08-29 MED ORDER — KETOROLAC TROMETHAMINE 30 MG/ML IJ SOLN
30.0000 mg | Freq: Once | INTRAMUSCULAR | Status: AC
  Administered 2015-08-29: 30 mg via INTRAMUSCULAR
  Filled 2015-08-29: qty 1

## 2015-08-29 NOTE — ED Notes (Signed)
Pt states she woke up this morning with right neck pain. Increased pain when she tries to raise her right arm. Denies dizziness or weakness.

## 2015-08-29 NOTE — Discharge Instructions (Signed)
Take medications as prescribed. Return for worsening symptoms, including numbness/weakness, vision or speech changes, or any other symptoms concerning to you.  Acute Torticollis Torticollis is a condition in which the muscles of the neck tighten (contract) abnormally, causing the neck to twist and the head to move into an unnatural position. Torticollis that develops suddenly is called acute torticollis.  CAUSES This condition may be caused by:  Sleeping in an awkward position (common).  Extending or twisting the neck muscles beyond their normal position.  Infection. In some cases, the cause may not be known. SYMPTOMS Symptoms of this condition include:  An unnatural position of the head.  Neck pain.  A limited ability to move the neck.  Twisting of the neck to one side. DIAGNOSIS This condition is diagnosed with a physical exam. You may also have imaging tests, such as an X-ray, CT scan, or MRI. TREATMENT Treatment for this condition involves trying to relax the neck muscles. It may include:  Medicines or shots.  Physical therapy.  Surgery. This may be done in severe cases. HOME CARE INSTRUCTIONS  Take medicines only as directed by your health care provider.  Do stretching exercises and massage your neck as directed by your health care provider.  Keep all follow-up visits as directed by your health care provider. This is important. SEEK MEDICAL CARE IF:  You develop a fever. SEEK IMMEDIATE MEDICAL CARE IF:  You develop difficulty breathing.  You develop noisy breathing (stridor).  You start drooling.  You have trouble swallowing or have pain with swallowing.  You develop numbness or weakness in your hands or feet.  You have changes in your speech, understanding, or vision.  Your pain gets worse.   This information is not intended to replace advice given to you by your health care provider. Make sure you discuss any questions you have with your health care  provider.   Document Released: 10/14/2000 Document Revised: 03/03/2015 Document Reviewed: 10/13/2014 Elsevier Interactive Patient Education [employer] Inc.

## 2015-08-29 NOTE — ED Provider Notes (Signed)
CSN: [id number]     Arrival date & time 08/29/15  0654 History   First MD Initiated Contact with Patient 08/29/15 [phone]     Chief Complaint  Patient presents with  . Neck Pain     (Consider location/radiation/quality/duration/timing/severity/associated sxs/prior Treatment) HPI 24 year old female, otherwise healthy, who presents with neck pain and neck spasms. States that she has been in her usual state of health, woke up this morning unable to turn her head towards the right due to severe right-sided neck pain. Pain traveling down to her right shoulder, and exacerbated when she tries to turn her head right word and when she moves her right arm. Denies any numbness, weakness, headache, vision or speech changes, trauma, or recent exertional activities.   Past Medical History  Diagnosis Date  . UTI (lower urinary tract infection)    History reviewed. No pertinent past surgical history. History reviewed. No pertinent family history. Social History  Substance Use Topics  . Smoking status: Former Smoker    Types: Cigarettes  . Smokeless tobacco: None  . Alcohol Use: No     Comment: occasionally drinks   OB History    Gravida Para Term Preterm AB TAB SAB Ectopic Multiple Living   0              Review of Systems  Constitutional: Negative for activity change.  Musculoskeletal: Positive for neck pain.  Allergic/Immunologic: Negative for immunocompromised state.  Neurological: Negative for weakness and numbness.  Hematological: Does not bruise/bleed easily.  All other systems reviewed and are negative.     Allergies  Review of patient's allergies indicates no known allergies.  Home Medications   Prior to Admission medications   Medication Sig Start Date End Date Taking? Authorizing Provider  cyclobenzaprine (FLEXERIL) 10 MG tablet Take 1 tablet (10 mg total) by mouth 3 (three) times daily as needed. 04/30/14   Marlis EdelsonWalidah N Karim, CNM  diazepam (VALIUM) 5 MG tablet Take 1 tablet  (5 mg total) by mouth every 6 (six) hours as needed for muscle spasms. 08/29/15   Lavera Guiseana Duo Liu, MD  HYDROcodone-acetaminophen (NORCO) 5-325 MG per tablet Take 1 tablet by mouth every 6 (six) hours as needed. 09/07/14   Tatyana Kirichenko, PA-C  ibuprofen (ADVIL,MOTRIN) 600 MG tablet Take 1 tablet (600 mg total) by mouth every 6 (six) hours as needed for mild pain or moderate pain. 08/29/15   Lavera Guiseana Duo Liu, MD   BP 116/82 mmHg  Pulse 63  Temp(Src) 98.1 F (36.7 C) (Oral)  Resp 20  Ht 5\' 3"  (1.6 m)  Wt 135 lb (61.236 kg)  BMI 23.92 kg/m2  SpO2 100%  LMP 07/24/2015 Physical Exam Physical Exam  Nursing note and vitals reviewed. Constitutional: Well developed, well nourished, non-toxic, and in no acute distress Head: Normocephalic and atraumatic.  Mouth/Throat: Mucous membranes are moist Neck: Neck supple. Tenderness to palpation of the right paraspinal muscles of the neck extending down to the trapezius muscle. No midline cervical spine tenderness.  Cardiovascular: +2 radial pulses Pulmonary/Chest: Effort normal. No conversational dyspnea. Abdominal: Soft. Non-distended.  Musculoskeletal: Normal range of motion of right upper extremity. No deformities, swelling, or bruising. Neurological: Alert, no facial droop, fluent speech, full motor and sensation in tact involving axillary, radial, median, and ulnar nerve distribution of upper extremities. Skin: Skin is warm and dry.  Psychiatric: Cooperative  ED Course  Procedures (including critical care time)    MDM   Final diagnoses:  Muscle spasms of neck  Torticollis  24 year old female, otherwise healthy, who woke up with neck pain/spasms. Symptoms localized to the right paraspinal neck muscles and trapezius.  Neurovascularly in tact exam. Given valium and NSAID, with improvement. Discharged with supportive care instructions.     Lavera Guise, MD 08/29/15 [phone]

## 2018-02-15 ENCOUNTER — Other Ambulatory Visit: Payer: Self-pay

## 2018-02-15 ENCOUNTER — Emergency Department (HOSPITAL_COMMUNITY): Payer: 59

## 2018-02-15 ENCOUNTER — Encounter (HOSPITAL_COMMUNITY): Payer: Self-pay

## 2018-02-15 ENCOUNTER — Emergency Department (HOSPITAL_COMMUNITY)
Admission: EM | Admit: 2018-02-15 | Discharge: 2018-02-15 | Disposition: A | Discharge status: Home / Self Care | Payer: 59 | Attending: Emergency Medicine | Admitting: Emergency Medicine

## 2018-02-15 DIAGNOSIS — Y9301 Activity, walking, marching and hiking: Secondary | ICD-10-CM | POA: Insufficient documentation

## 2018-02-15 DIAGNOSIS — S92524A Nondisplaced fracture of medial phalanx of right lesser toe(s), initial encounter for closed fracture: Secondary | ICD-10-CM | POA: Insufficient documentation

## 2018-02-15 DIAGNOSIS — Z87891 Personal history of nicotine dependence: Secondary | ICD-10-CM | POA: Insufficient documentation

## 2018-02-15 DIAGNOSIS — S92501A Displaced unspecified fracture of right lesser toe(s), initial encounter for closed fracture: Secondary | ICD-10-CM

## 2018-02-15 DIAGNOSIS — Y999 Unspecified external cause status: Secondary | ICD-10-CM | POA: Insufficient documentation

## 2018-02-15 DIAGNOSIS — S92534A Nondisplaced fracture of distal phalanx of right lesser toe(s), initial encounter for closed fracture: Secondary | ICD-10-CM | POA: Insufficient documentation

## 2018-02-15 DIAGNOSIS — W228XXA Striking against or struck by other objects, initial encounter: Secondary | ICD-10-CM | POA: Insufficient documentation

## 2018-02-15 DIAGNOSIS — Y92009 Unspecified place in unspecified non-institutional (private) residence as the place of occurrence of the external cause: Secondary | ICD-10-CM | POA: Insufficient documentation

## 2018-02-15 LAB — POC URINE PREG, ED: Preg Test, Ur: NEGATIVE

## 2018-02-15 MED ORDER — TRAMADOL HCL 50 MG PO TABS: 50.0000 mg | ORAL_TABLET | Freq: Four times a day (QID) | ORAL | 0 refills | Status: AC | PRN

## 2018-02-15 MED ORDER — DICLOFENAC SODIUM 50 MG PO TBEC: 50.0000 mg | DELAYED_RELEASE_TABLET | Freq: Two times a day (BID) | ORAL | 0 refills | Status: AC

## 2018-02-15 NOTE — ED Provider Notes (Signed)
[facility]-EMERGENCY DEPT Provider Note   CSN: [id number] Arrival date & time: 02/15/18  1655     History   Chief Complaint Chief Complaint  Patient presents with  . Toe Pain    HPI Martha Sandoval is a 27 y.o. female who presents to the ED with toe pain. The pain is located in the right little toe. Patient reports she kicked the side of her bed by accident this morning about 11:00 and has increased swelling and bruising since the injury. Patient denies any other injuries.   HPI  Past Medical History:  Diagnosis Date  . UTI (lower urinary tract infection)     There are no active problems to display for this patient.   History reviewed. No pertinent surgical history.   OB History    Gravida  0   Para      Term      Preterm      AB      Living        SAB      TAB      Ectopic      Multiple      Live Births               Home Medications    Prior to Admission medications   Medication Sig Start Date End Date Taking? Authorizing Provider  cyclobenzaprine (FLEXERIL) 10 MG tablet Take 1 tablet (10 mg total) by mouth 3 (three) times daily as needed. 04/30/14   Marlis EdelsonKarim, Walidah N, CNM  diazepam (VALIUM) 5 MG tablet Take 1 tablet (5 mg total) by mouth every 6 (six) hours as needed for muscle spasms. 08/29/15   Lavera GuiseLiu, Dana Duo, MD  diclofenac (VOLTAREN) 50 MG EC tablet Take 1 tablet (50 mg total) by mouth 2 (two) times daily. 02/15/18   Janne NapoleonNeese, Hope M, NP  traMADol (ULTRAM) 50 MG tablet Take 1 tablet (50 mg total) by mouth every 6 (six) hours as needed. 02/15/18   Janne NapoleonNeese, Hope M, NP  medroxyPROGESTERone (PROVERA) 5 MG tablet Take 1 tablet (5 mg total) by mouth daily. 11/22/11 01/15/12  Charlestine NightLawyer, Christopher, PA-C    Family History History reviewed. No pertinent family history.  Social History Social History   Tobacco Use  . Smoking status: Former Smoker    Types: Cigarettes  Substance Use Topics  . Alcohol use: No    Comment: occasionally  drinks  . Drug use: No     Allergies   Patient has no known allergies.   Review of Systems Review of Systems  Musculoskeletal: Positive for arthralgias.  All other systems reviewed and are negative.    Physical Exam Updated Vital Signs BP 108/65   Pulse 63   Temp 99.8 F (37.7 C) (Oral)   Wt 61.2 kg (135 lb)   LMP 02/13/2018   SpO2 100%   BMI 23.91 kg/m   Physical Exam  Constitutional: She is oriented to person, place, and time. She appears well-developed and well-nourished. No distress.  HENT:  Head: Normocephalic.  Eyes: EOM are normal.  Neck: Neck supple.  Cardiovascular: Normal rate.  Pulmonary/Chest: Effort normal.  Musculoskeletal:       Feet:  Neurological: She is alert and oriented to person, place, and time. No cranial nerve deficit.  Skin: Skin is warm and dry.  Psychiatric: She has a normal mood and affect.  Nursing note and vitals reviewed.    ED Treatments / Results  Labs (all labs ordered are listed, but  only abnormal results are displayed) Labs Reviewed  POC URINE PREG, ED   Radiology Dg Toe 5th Right  Result Date: 02/15/2018 CLINICAL DATA:  Patient kicked a fixed object. Increasing pain and swelling. EXAM: RIGHT FIFTH TOE COMPARISON:  None. FINDINGS: The middle and distal phalanges are difficult to separate, likely arthrodesis. This effectively creates a proximal and distal phalanx. There is a transverse fracture through the distal phalanx, roughly in its midportion. Soft tissue swelling. IMPRESSION: Spontaneous arthrodesis of the middle and distal phalanges, with a transverse fracture through its midportion. Soft tissue swelling. Electronically Signed   By: Elsie Stain M.D.   On: 02/15/2018 19:00    Procedures Procedures (including critical care time)  Medications Ordered in ED Medications - No data to display   Initial Impression / Assessment and Plan / ED Course  I have reviewed the triage vital signs and the nursing notes. 27  y.o. female with right little toe pain s/p injury stable for d/c without vascular compromise. There is a fracture of the right fifth toe. Buddy tape and post op shoe and f/u with PCP. Ice, elevation, return precautions.   Final Clinical Impressions(s) / ED Diagnoses   Final diagnoses:  Closed fracture of phalanx of right fifth toe, initial encounter    ED Discharge Orders        Ordered    traMADol (ULTRAM) 50 MG tablet  Every 6 hours PRN     02/15/18 1913    diclofenac (VOLTAREN) 50 MG EC tablet  2 times daily     02/15/18 1913       Kerrie Buffalo [place], [place] 02/15/18 2215    Benjiman Core, MD 02/15/18 [phone]

## 2018-02-15 NOTE — ED Triage Notes (Signed)
Patient presents with right baby toe pain and bruising. Patient reports she kicked the side of her bed by accident this morning at approx 1100. Patient reports increased swelling, bruising and pain throughout the day. Patient ambulatory in triage.

## 2018-02-15 NOTE — Discharge Instructions (Addendum)
Do not drive when taking the narcotic pain medication as it will make you sleepy.

## 2019-09-11 ENCOUNTER — Other Ambulatory Visit (HOSPITAL_COMMUNITY)
Admission: RE | Admit: 2019-09-11 | Discharge: 2019-09-11 | Disposition: A | Discharge status: Home / Self Care | Payer: 59 | Source: Ambulatory Visit | Attending: Obstetrics and Gynecology | Admitting: Obstetrics and Gynecology

## 2019-09-11 DIAGNOSIS — Z124 Encounter for screening for malignant neoplasm of cervix: Secondary | ICD-10-CM | POA: Insufficient documentation

## 2019-11-12 ENCOUNTER — Ambulatory Visit: Payer: 59 | Attending: Internal Medicine

## 2019-11-12 DIAGNOSIS — Z20822 Contact with and (suspected) exposure to covid-19: Secondary | ICD-10-CM

## 2019-11-14 LAB — NOVEL CORONAVIRUS, NAA: SARS-CoV-2, NAA: NOT DETECTED
